# Patient Record
Sex: Female | Born: 1990 | Hispanic: Yes | Smoking: Never smoker
Health system: Southern US, Community
[De-identification: ages and names within clinical notes are randomized; demographics above are authoritative.]

## PROBLEM LIST (undated history)

## (undated) DIAGNOSIS — N390 Urinary tract infection, site not specified: Secondary | ICD-10-CM

## (undated) HISTORY — DX: Urinary tract infection, site not specified: N39.0

---

## 2011-07-29 NOTE — L&D Delivery Note (Signed)
Delivery Note At 8:33 PM a viable female was delivered via  (Presentation: ;  ).  APGAR: , ; weight .   Placenta status: , .  Cord:  with the following complications: .  Cord pH: not done  Anesthesia: Epidural  Episiotomy:  Lacerations:  Suture Repair: 2.0 vicryl Est. Blood Loss (mL):   Mom to postpartum.  Baby to nursery-stable.  Kerrilyn Azbill A 05/07/2012, 8:43 PM

## 2012-03-03 LAB — OB RESULTS CONSOLE RUBELLA ANTIBODY, IGM: Rubella: IMMUNE

## 2012-03-03 LAB — OB RESULTS CONSOLE ANTIBODY SCREEN: Antibody Screen: NEGATIVE

## 2012-05-07 ENCOUNTER — Inpatient Hospital Stay (HOSPITAL_COMMUNITY): Payer: Medicaid Other | Admitting: Anesthesiology

## 2012-05-07 ENCOUNTER — Inpatient Hospital Stay (HOSPITAL_COMMUNITY)
Admission: AD | Admit: 2012-05-07 | Discharge: 2012-05-09 | DRG: 775 | Disposition: A | Discharge status: Home / Self Care | Payer: Medicaid Other | Source: Ambulatory Visit | Attending: Obstetrics | Admitting: Obstetrics

## 2012-05-07 ENCOUNTER — Encounter (HOSPITAL_COMMUNITY): Payer: Self-pay | Admitting: *Deleted

## 2012-05-07 ENCOUNTER — Inpatient Hospital Stay (HOSPITAL_COMMUNITY)
Admission: AD | Admit: 2012-05-07 | Discharge: 2012-05-07 | Disposition: A | Discharge status: Home / Self Care | Payer: Medicaid Other | Source: Ambulatory Visit | Attending: Obstetrics | Admitting: Obstetrics

## 2012-05-07 ENCOUNTER — Encounter (HOSPITAL_COMMUNITY): Payer: Self-pay | Admitting: Anesthesiology

## 2012-05-07 DIAGNOSIS — O479 False labor, unspecified: Secondary | ICD-10-CM | POA: Insufficient documentation

## 2012-05-07 LAB — OB RESULTS CONSOLE HIV ANTIBODY (ROUTINE TESTING): HIV: NONREACTIVE

## 2012-05-07 LAB — OB RESULTS CONSOLE GBS: GBS: NEGATIVE

## 2012-05-07 LAB — CBC
HCT: 38.9 % (ref 36.0–46.0)
Hemoglobin: 12.8 g/dL (ref 12.0–15.0)
MCH: 28.9 pg (ref 26.0–34.0)
MCHC: 32.9 g/dL (ref 30.0–36.0)
MCV: 87.8 fL (ref 78.0–100.0)
Platelets: 211 K/uL (ref 150–400)
RBC: 4.43 MIL/uL (ref 3.87–5.11)
RDW: 14.2 % (ref 11.5–15.5)
WBC: 13 K/uL — ABNORMAL HIGH (ref 4.0–10.5)

## 2012-05-07 LAB — OB RESULTS CONSOLE GC/CHLAMYDIA
Chlamydia: NEGATIVE
Gonorrhea: NEGATIVE

## 2012-05-07 LAB — OB RESULTS CONSOLE ABO/RH: RH Type: POSITIVE

## 2012-05-07 LAB — OB RESULTS CONSOLE HEPATITIS B SURFACE ANTIGEN: Hepatitis B Surface Ag: NEGATIVE

## 2012-05-07 LAB — OB RESULTS CONSOLE RPR: RPR: NONREACTIVE

## 2012-05-07 MED ORDER — BENZOCAINE-MENTHOL 20-0.5 % EX AERO
1.0000 "application " | INHALATION_SPRAY | CUTANEOUS | Status: DC | PRN
  Administered 2012-05-08: 1 via TOPICAL
  Filled 2012-05-07: qty 56

## 2012-05-07 MED ORDER — LANOLIN HYDROUS EX OINT: TOPICAL_OINTMENT | CUTANEOUS | Status: DC | PRN

## 2012-05-07 MED ORDER — EPHEDRINE 5 MG/ML INJ
10.0000 mg | INTRAVENOUS | Status: DC | PRN
  Filled 2012-05-07: qty 4

## 2012-05-07 MED ORDER — EPHEDRINE 5 MG/ML INJ: 10.0000 mg | INTRAVENOUS | Status: DC | PRN

## 2012-05-07 MED ORDER — LIDOCAINE HCL (PF) 1 % IJ SOLN
INTRAMUSCULAR | Status: DC | PRN
  Administered 2012-05-07 (×4): 4 mL

## 2012-05-07 MED ORDER — TETANUS-DIPHTH-ACELL PERTUSSIS 5-2.5-18.5 LF-MCG/0.5 IM SUSP
0.5000 mL | Freq: Once | INTRAMUSCULAR | Status: AC
  Administered 2012-05-08: 0.5 mL via INTRAMUSCULAR
  Filled 2012-05-07: qty 0.5

## 2012-05-07 MED ORDER — CITRIC ACID-SODIUM CITRATE 334-500 MG/5ML PO SOLN: 30.0000 mL | ORAL | Status: DC | PRN

## 2012-05-07 MED ORDER — DIPHENHYDRAMINE HCL 25 MG PO CAPS: 25.0000 mg | ORAL_CAPSULE | Freq: Four times a day (QID) | ORAL | Status: DC | PRN

## 2012-05-07 MED ORDER — ONDANSETRON HCL 4 MG PO TABS: 4.0000 mg | ORAL_TABLET | ORAL | Status: DC | PRN

## 2012-05-07 MED ORDER — FERROUS SULFATE 325 (65 FE) MG PO TABS
325.0000 mg | ORAL_TABLET | Freq: Two times a day (BID) | ORAL | Status: DC
  Administered 2012-05-08 – 2012-05-09 (×3): 325 mg via ORAL
  Filled 2012-05-07 (×3): qty 1

## 2012-05-07 MED ORDER — FENTANYL 2.5 MCG/ML BUPIVACAINE 1/10 % EPIDURAL INFUSION (WH - ANES)
14.0000 mL/h | INTRAMUSCULAR | Status: DC
  Administered 2012-05-07 (×2): 14 mL/h via EPIDURAL
  Filled 2012-05-07: qty 125

## 2012-05-07 MED ORDER — OXYCODONE-ACETAMINOPHEN 5-325 MG PO TABS
2.0000 | ORAL_TABLET | Freq: Once | ORAL | Status: AC
  Administered 2012-05-07: 2 via ORAL
  Filled 2012-05-07: qty 2

## 2012-05-07 MED ORDER — DIBUCAINE 1 % RE OINT: 1.0000 "application " | TOPICAL_OINTMENT | RECTAL | Status: DC | PRN

## 2012-05-07 MED ORDER — SENNOSIDES-DOCUSATE SODIUM 8.6-50 MG PO TABS
2.0000 | ORAL_TABLET | Freq: Every day | ORAL | Status: DC
  Administered 2012-05-09: 2 via ORAL

## 2012-05-07 MED ORDER — SIMETHICONE 80 MG PO CHEW: 80.0000 mg | CHEWABLE_TABLET | ORAL | Status: DC | PRN

## 2012-05-07 MED ORDER — PRENATAL MULTIVITAMIN CH
1.0000 | ORAL_TABLET | Freq: Every day | ORAL | Status: DC
  Administered 2012-05-08 – 2012-05-09 (×2): 1 via ORAL
  Filled 2012-05-07 (×2): qty 1

## 2012-05-07 MED ORDER — OXYTOCIN 40 UNITS IN LACTATED RINGERS INFUSION - SIMPLE MED
62.5000 mL/h | Freq: Once | INTRAVENOUS | Status: DC
  Filled 2012-05-07: qty 1000

## 2012-05-07 MED ORDER — LACTATED RINGERS IV SOLN
500.0000 mL | Freq: Once | INTRAVENOUS | Status: AC
  Administered 2012-05-07: 1000 mL via INTRAVENOUS

## 2012-05-07 MED ORDER — PHENYLEPHRINE 40 MCG/ML (10ML) SYRINGE FOR IV PUSH (FOR BLOOD PRESSURE SUPPORT): 80.0000 ug | PREFILLED_SYRINGE | INTRAVENOUS | Status: DC | PRN

## 2012-05-07 MED ORDER — BUTORPHANOL TARTRATE 1 MG/ML IJ SOLN
1.0000 mg | INTRAMUSCULAR | Status: DC | PRN
  Administered 2012-05-07: 1 mg via INTRAVENOUS
  Filled 2012-05-07: qty 1

## 2012-05-07 MED ORDER — ACETAMINOPHEN 325 MG PO TABS: 650.0000 mg | ORAL_TABLET | ORAL | Status: DC | PRN

## 2012-05-07 MED ORDER — DIPHENHYDRAMINE HCL 50 MG/ML IJ SOLN: 12.5000 mg | INTRAMUSCULAR | Status: DC | PRN

## 2012-05-07 MED ORDER — LACTATED RINGERS IV SOLN: 500.0000 mL | INTRAVENOUS | Status: DC | PRN

## 2012-05-07 MED ORDER — LIDOCAINE HCL (PF) 1 % IJ SOLN
30.0000 mL | INTRAMUSCULAR | Status: DC | PRN
  Administered 2012-05-07: 30 mL via SUBCUTANEOUS
  Filled 2012-05-07: qty 30

## 2012-05-07 MED ORDER — OXYTOCIN BOLUS FROM INFUSION
500.0000 mL | Freq: Once | INTRAVENOUS | Status: AC
  Administered 2012-05-07: 500 mL via INTRAVENOUS
  Filled 2012-05-07: qty 500

## 2012-05-07 MED ORDER — LACTATED RINGERS IV SOLN
INTRAVENOUS | Status: DC
  Administered 2012-05-07: 15:00:00 via INTRAVENOUS

## 2012-05-07 MED ORDER — PHENYLEPHRINE 40 MCG/ML (10ML) SYRINGE FOR IV PUSH (FOR BLOOD PRESSURE SUPPORT)
80.0000 ug | PREFILLED_SYRINGE | INTRAVENOUS | Status: DC | PRN
  Filled 2012-05-07: qty 5

## 2012-05-07 MED ORDER — LIDOCAINE-EPINEPHRINE (PF) 2 %-1:200000 IJ SOLN
INTRAMUSCULAR | Status: DC | PRN
  Administered 2012-05-07: 60 mg via INTRADERMAL

## 2012-05-07 MED ORDER — ONDANSETRON HCL 4 MG/2ML IJ SOLN: 4.0000 mg | Freq: Four times a day (QID) | INTRAMUSCULAR | Status: DC | PRN

## 2012-05-07 MED ORDER — LIDOCAINE-EPINEPHRINE (PF) 2 %-1:200000 IJ SOLN: INTRAMUSCULAR | Status: DC | PRN

## 2012-05-07 MED ORDER — OXYCODONE-ACETAMINOPHEN 5-325 MG PO TABS
1.0000 | ORAL_TABLET | ORAL | Status: DC | PRN
  Filled 2012-05-07: qty 1

## 2012-05-07 MED ORDER — ZOLPIDEM TARTRATE 5 MG PO TABS: 5.0000 mg | ORAL_TABLET | Freq: Every evening | ORAL | Status: DC | PRN

## 2012-05-07 MED ORDER — IBUPROFEN 600 MG PO TABS
600.0000 mg | ORAL_TABLET | Freq: Four times a day (QID) | ORAL | Status: DC | PRN
  Administered 2012-05-07: 600 mg via ORAL
  Filled 2012-05-07 (×5): qty 1

## 2012-05-07 MED ORDER — ONDANSETRON HCL 4 MG/2ML IJ SOLN: 4.0000 mg | INTRAMUSCULAR | Status: DC | PRN

## 2012-05-07 MED ORDER — IBUPROFEN 600 MG PO TABS
600.0000 mg | ORAL_TABLET | Freq: Four times a day (QID) | ORAL | Status: DC
  Administered 2012-05-08 – 2012-05-09 (×6): 600 mg via ORAL
  Filled 2012-05-07 (×2): qty 1

## 2012-05-07 MED ORDER — WITCH HAZEL-GLYCERIN EX PADS: 1.0000 "application " | MEDICATED_PAD | CUTANEOUS | Status: DC | PRN

## 2012-05-07 MED ORDER — OXYCODONE-ACETAMINOPHEN 5-325 MG PO TABS
1.0000 | ORAL_TABLET | ORAL | Status: DC | PRN
  Administered 2012-05-08 – 2012-05-09 (×4): 1 via ORAL
  Filled 2012-05-07 (×3): qty 1

## 2012-05-07 NOTE — Anesthesia Preprocedure Evaluation (Signed)

## 2012-05-07 NOTE — MAU Note (Signed)
PT SAYS WITH INTERPRETER- ANA-   STARTING HURT BAD AT MN.      IN OFFICE - VE  1 CM ON Tuesday.   DENIES HSV AND MRSA.

## 2012-05-07 NOTE — Anesthesia Procedure Notes (Signed)
Epidural Patient location during procedure: OB Start time: 05/07/2012 4:25 PM  Staffing Performed by: anesthesiologist   Preanesthetic Checklist Completed: patient identified, site marked, surgical consent, pre-op evaluation, timeout performed, IV checked, risks and benefits discussed and monitors and equipment checked  Epidural Patient position: sitting Prep: site prepped and draped and DuraPrep Patient monitoring: continuous pulse ox and blood pressure Approach: midline Injection technique: LOR air  Needle:  Needle type: Tuohy  Needle gauge: 17 G Needle length: 9 cm and 9 Needle insertion depth: 5 cm cm Catheter type: closed end flexible Catheter size: 19 Gauge Catheter at skin depth: 10 cm Test dose: negative  Assessment Events: blood not aspirated, injection not painful, no injection resistance, negative IV test and no paresthesia  Additional Notes Discussed risk of headache, infection, bleeding, nerve injury and failed or incomplete block.  Patient voices understanding and wishes to proceed. Reason for block:procedure for pain

## 2012-05-07 NOTE — H&P (Signed)
This is Dr. Francoise Ceo dictating the history and physical on  Betty [name] she's a 21 year old prima gravida at 40 weeks and 4 days her EDC is 05/03/2012 negative GBS and she was admitted in labor cervix 5 cm 100% vertex at a 0 station she is now 8 cm 100% 0 station amniotomy was performed and the fluids clear Past medical history negative Past surgical history negative Social history noncontributory Family history negative System review negative Physical exam well-developed female in labor HEENT negative Lungs clear to P&A Heart regular rhythm no murmurs no murmurs or gallops Breasts negative Abdomen term Pelvic as described above Extremities negative

## 2012-05-07 NOTE — MAU Note (Signed)
UC's since MN. Was seen in MAU, was 3cm. UC's have continued. Bloody show.

## 2012-05-08 LAB — CBC
Hemoglobin: 11 g/dL — ABNORMAL LOW (ref 12.0–15.0)
MCH: 29.1 pg (ref 26.0–34.0)
MCHC: 32.9 g/dL (ref 30.0–36.0)

## 2012-05-08 LAB — TYPE AND SCREEN: ABO/RH(D): O POS

## 2012-05-08 MED ORDER — INFLUENZA VIRUS VACC SPLIT PF IM SUSP
0.5000 mL | INTRAMUSCULAR | Status: AC
  Administered 2012-05-08: 0.5 mL via INTRAMUSCULAR
  Filled 2012-05-08: qty 0.5

## 2012-05-08 NOTE — Progress Notes (Signed)
Patient ID: Betty [name], female   DOB: Jun 30, 1991, 21 y.o.   MRN: [id number] Postpartum day one Vital signs normal Fundus firm Lochia moderate Legs negative doing well

## 2012-05-08 NOTE — Anesthesia Postprocedure Evaluation (Signed)
Anesthesia Post Note  Patient: Betty [name]  Procedure(s) Performed: * No procedures listed *  Anesthesia type: Epidural  Patient location: Mother/Baby  Post pain: Pain level controlled  Post assessment: Post-op Vital signs reviewed  Last Vitals:  Filed Vitals:   05/08/12 0330  BP: 100/57  Pulse: 72  Temp: 36.7 C  Resp: 18    Post vital signs: Reviewed  Level of consciousness: awake  Complications: No apparent anesthesia complications

## 2012-05-09 NOTE — Discharge Summary (Signed)
Obstetric Discharge Summary Reason for Admission: onset of labor Prenatal Procedures: none Intrapartum Procedures: spontaneous vaginal delivery Postpartum Procedures: none Complications-Operative and Postpartum: none Hemoglobin  Date Value Range Status  05/08/2012 11.0* 12.0 - 15.0 g/dL Final     HCT  Date Value Range Status  05/08/2012 33.4* 36.0 - 46.0 % Final    Physical Exam:  General: alert Lochia: appropriate Uterine Fundus: firm Incision: healing well DVT Evaluation: No evidence of DVT seen on physical exam.  Discharge Diagnoses: Term Pregnancy-delivered  Discharge Information: Date: 05/09/2012 Activity: pelvic rest Diet: routine Medications: Percocet Condition: stable Instructions: refer to practice specific booklet Discharge to: home Follow-up Information    Call in 6 weeks to follow up.   Contact information:   b marshall         Newborn Data: Live born female  Birth Weight: 7 lb 5.1 oz (3320 g) APGAR: 9, 9  Home with mother.  MARSHALL,BERNARD A 05/09/2012, 6:44 AM

## 2012-05-10 NOTE — Progress Notes (Signed)
Post discharge chart review completed.  

## 2014-05-29 ENCOUNTER — Encounter (HOSPITAL_COMMUNITY): Payer: Self-pay | Admitting: *Deleted

## 2015-07-29 NOTE — L&D Delivery Note (Signed)
Delivery Note At 2:15 PM a viable and healthy female was delivered via Vaginal, Spontaneous Delivery (Presentation: OA).  APGAR: 8, 9; weight Pending.   Placenta status: Spontaneous, intact.  Cord:  with the following complications: short.  Cord pH: NA  Anesthesia: None Episiotomy: None Lacerations: None Suture Repair: None Est. Blood Loss (mL): 250  Mom to postpartum.  Baby to Couplet care / Skin to Skin. Placenta to: BS Feeding: Breast Circ: NO Contraception: Undecided  [place] 03/01/2016, 3:29 PM

## 2015-08-23 LAB — OB RESULTS CONSOLE GC/CHLAMYDIA
CHLAMYDIA, DNA PROBE: NEGATIVE
Gonorrhea: NEGATIVE

## 2015-08-23 LAB — OB RESULTS CONSOLE HIV ANTIBODY (ROUTINE TESTING): HIV: NONREACTIVE

## 2015-08-23 LAB — OB RESULTS CONSOLE ABO/RH: RH TYPE: POSITIVE

## 2015-08-23 LAB — OB RESULTS CONSOLE HGB/HCT, BLOOD
HCT: 38 %
Hemoglobin: 12.2 g/dL

## 2015-08-23 LAB — OB RESULTS CONSOLE HEPATITIS B SURFACE ANTIGEN: HEP B S AG: NEGATIVE

## 2015-08-23 LAB — OB RESULTS CONSOLE RPR: RPR: NONREACTIVE

## 2015-08-23 LAB — OB RESULTS CONSOLE ANTIBODY SCREEN: ANTIBODY SCREEN: NEGATIVE

## 2015-08-23 LAB — OB RESULTS CONSOLE RUBELLA ANTIBODY, IGM: Rubella: IMMUNE

## 2015-08-23 LAB — CULTURE, OB URINE: Urine Culture, OB: NO GROWTH

## 2015-12-17 LAB — GLUCOSE TOLERANCE, 1 HOUR: GLUCOSE 1 HOUR: 91

## 2015-12-17 LAB — OB RESULTS CONSOLE HGB/HCT, BLOOD: HEMOGLOBIN: 11 g/dL

## 2016-01-31 ENCOUNTER — Ambulatory Visit (INDEPENDENT_AMBULATORY_CARE_PROVIDER_SITE_OTHER): Payer: Self-pay | Admitting: Family

## 2016-01-31 ENCOUNTER — Encounter: Payer: Self-pay | Admitting: Family

## 2016-01-31 VITALS — BP 111/66 | HR 79 | Wt 167.9 lb

## 2016-01-31 DIAGNOSIS — Z3490 Encounter for supervision of normal pregnancy, unspecified, unspecified trimester: Secondary | ICD-10-CM | POA: Insufficient documentation

## 2016-01-31 DIAGNOSIS — O0933 Supervision of pregnancy with insufficient antenatal care, third trimester: Secondary | ICD-10-CM

## 2016-01-31 DIAGNOSIS — O0930 Supervision of pregnancy with insufficient antenatal care, unspecified trimester: Secondary | ICD-10-CM | POA: Insufficient documentation

## 2016-01-31 DIAGNOSIS — Z3483 Encounter for supervision of other normal pregnancy, third trimester: Secondary | ICD-10-CM

## 2016-01-31 DIAGNOSIS — Z3493 Encounter for supervision of normal pregnancy, unspecified, third trimester: Secondary | ICD-10-CM

## 2016-01-31 LAB — POCT URINALYSIS DIP (DEVICE)
Bilirubin Urine: NEGATIVE
GLUCOSE, UA: NEGATIVE mg/dL
Ketones, ur: NEGATIVE mg/dL
Nitrite: NEGATIVE
PROTEIN: NEGATIVE mg/dL
SPECIFIC GRAVITY, URINE: 1.02 (ref 1.005–1.030)
UROBILINOGEN UA: 0.2 mg/dL (ref 0.0–1.0)
pH: 5.5 (ref 5.0–8.0)

## 2016-01-31 NOTE — Patient Instructions (Signed)
Tercer trimestre de embarazo (Third Trimester of Pregnancy) El tercer trimestre comprende desde la semana29 hasta la semana42, es decir, desde el mes7 hasta el mes9. El tercer trimestre es un perodo en el que el feto crece rpidamente. Hacia el final del noveno mes, el feto mide alrededor de 20pulgadas (45cm) de largo y pesa entre 6 y 10 libras (2,700 y 4,500kg).  CAMBIOS EN EL ORGANISMO Su organismo atraviesa por muchos cambios durante el embarazo, y estos varan de una mujer a otra.   Seguir aumentando de peso. Es de esperar que aumente entre 25 y 35libras (11 y 16kg) hacia el final del embarazo.  Podrn aparecer las primeras estras en las caderas, el abdomen y las mamas.  Puede tener necesidad de orinar con ms frecuencia porque el feto baja hacia la pelvis y ejerce presin sobre la vejiga.  Debido al embarazo podr sentir acidez estomacal con frecuencia.  Puede estar estreida, ya que ciertas hormonas enlentecen los movimientos de los msculos que empujan los desechos a travs de los intestinos.  Pueden aparecer hemorroides o abultarse e hincharse las venas (venas varicosas).  Puede sentir dolor plvico debido al aumento de peso y a que las hormonas del embarazo relajan las articulaciones entre los huesos de la pelvis. El dolor de espalda puede ser consecuencia de la sobrecarga de los msculos que soportan la postura.  Tal vez haya cambios en el cabello que pueden incluir su engrosamiento, crecimiento rpido y cambios en la textura. Adems, a algunas mujeres se les cae el cabello durante o despus del embarazo, o tienen el cabello seco o fino. Lo ms probable es que el cabello se le normalice despus del nacimiento del beb.  Las mamas seguirn creciendo y le dolern. A veces, puede haber una secrecin amarilla de las mamas llamada calostro.  El ombligo puede salir hacia afuera.  Puede sentir que le falta el aire debido a que se expande el tero.  Puede notar que el feto  "baja" o lo siente ms bajo, en el abdomen.  Puede tener una prdida de secrecin mucosa con sangre. Esto suele ocurrir en el trmino de unos pocos das a una semana antes de que comience el trabajo de parto.  El cuello del tero se vuelve delgado y blando (se borra) cerca de la fecha de parto. QU DEBE ESPERAR EN LOS EXMENES PRENATALES  Le harn exmenes prenatales cada 2semanas hasta la semana36. A partir de ese momento le harn exmenes semanales. Durante una visita prenatal de rutina:  La pesarn para asegurarse de que usted y el feto estn creciendo normalmente.  Le tomarn la presin arterial.  Le medirn el abdomen para controlar el desarrollo del beb.  Se escucharn los latidos cardacos fetales.  Se evaluarn los resultados de los estudios solicitados en visitas anteriores.  Le revisarn el cuello del tero cuando est prxima la fecha de parto para controlar si este se ha borrado. Alrededor de la semana36, el mdico le revisar el cuello del tero. Al mismo tiempo, realizar un anlisis de las secreciones del tejido vaginal. Este examen es para determinar si hay un tipo de bacteria, estreptococo Grupo B. El mdico le explicar esto con ms detalle. El mdico puede preguntarle lo siguiente:  Cmo le gustara que fuera el parto.  Cmo se siente.  Si siente los movimientos del beb.  Si ha tenido sntomas anormales, como prdida de lquido, sangrado, dolores de cabeza intensos o clicos abdominales.  Si est consumiendo algn producto que contenga tabaco, como cigarrillos, tabaco   de mascar y cigarrillos electrnicos.  Si tiene alguna pregunta. Otros exmenes o estudios de deteccin que pueden realizarse durante el tercer trimestre incluyen lo siguiente:  Anlisis de sangre para controlar los niveles de hierro (anemia).  Controles fetales para determinar su salud, nivel de actividad y crecimiento. Si tiene alguna enfermedad o hay problemas durante el embarazo, le harn  estudios.  Prueba del VIH (virus de inmunodeficiencia humana). Si corre un riesgo alto, pueden realizarle una prueba de deteccin del VIH durante el tercer trimestre del embarazo. FALSO TRABAJO DE PARTO Es posible que sienta contracciones leves e irregulares que finalmente desaparecen. Se llaman contracciones de Braxton Hicks o falso trabajo de parto. Las contracciones pueden durar horas, das o incluso semanas, antes de que el verdadero trabajo de parto se inicie. Si las contracciones ocurren a intervalos regulares, se intensifican o se hacen dolorosas, lo mejor es que la revise el mdico.  SIGNOS DE TRABAJO DE PARTO   Clicos de tipo menstrual.  Contracciones cada 5minutos o menos.  Contracciones que comienzan en la parte superior del tero y se extienden hacia abajo, a la zona inferior del abdomen y la espalda.  Sensacin de mayor presin en la pelvis o dolor de espalda.  Una secrecin de mucosidad acuosa o con sangre que sale de la vagina. Si tiene alguno de estos signos antes de la semana37 del embarazo, llame a su mdico de inmediato. Debe concurrir al hospital para que la controlen inmediatamente. INSTRUCCIONES PARA EL CUIDADO EN EL HOGAR   Evite fumar, consumir hierbas, beber alcohol y tomar frmacos que no le hayan recetado. Estas sustancias qumicas afectan la formacin y el desarrollo del beb.  No consuma ningn producto que contenga tabaco, lo que incluye cigarrillos, tabaco de mascar y cigarrillos electrnicos. Si necesita ayuda para dejar de fumar, consulte al mdico. Puede recibir asesoramiento y otro tipo de recursos para dejar de fumar.  Siga las indicaciones del mdico en relacin con el uso de medicamentos. Durante el embarazo, hay medicamentos que son seguros de tomar y otros que no.  Haga ejercicio solamente como se lo haya indicado el mdico. Sentir clicos uterinos es un buen signo para detener la actividad fsica.  Contine comiendo alimentos sanos con  regularidad.  Use un sostn que le brinde buen soporte si le duelen las mamas.  No se d baos de inmersin en agua caliente, baos turcos ni saunas.  Use el cinturn de seguridad en todo momento mientras conduce.  No coma carne cruda ni queso sin cocinar; evite el contacto con las bandejas sanitarias de los gatos y la tierra que estos animales usan. Estos elementos contienen grmenes que pueden causar defectos congnitos en el beb.  Tome las vitaminas prenatales.  Tome entre 1500 y 2000mg de calcio diariamente comenzando en la semana20 del embarazo hasta el parto.  Si est estreida, pruebe un laxante suave (si el mdico lo autoriza). Consuma ms alimentos ricos en fibra, como vegetales y frutas frescos y cereales integrales. Beba gran cantidad de lquido para mantener la orina de tono claro o color amarillo plido.  Dese baos de asiento con agua tibia para aliviar el dolor o las molestias causadas por las hemorroides. Use una crema para las hemorroides si el mdico la autoriza.  Si tiene venas varicosas, use medias de descanso. Eleve los pies durante 15minutos, 3 o 4veces por da. Limite el consumo de sal en su dieta.  Evite levantar objetos pesados, use zapatos de tacones bajos y mantenga una buena postura.  Descanse   con las piernas elevadas si tiene calambres o dolor de cintura.  Visite a su dentista si no lo ha hecho durante el embarazo. Use un cepillo de dientes blando para higienizarse los dientes y psese el hilo dental con suavidad.  Puede seguir manteniendo relaciones sexuales, a menos que el mdico le indique lo contrario.  No haga viajes largos excepto que sea absolutamente necesario y solo con la autorizacin del mdico.  Tome clases prenatales para entender, practicar y hacer preguntas sobre el trabajo de parto y el parto.  Haga un ensayo de la partida al hospital.  Prepare el bolso que llevar al hospital.  Prepare la habitacin del beb.  Concurra a todas  las visitas prenatales segn las indicaciones de su mdico. SOLICITE ATENCIN MDICA SI:  No est segura de que est en trabajo de parto o de que ha roto la bolsa de las aguas.  Tiene mareos.  Siente clicos leves, presin en la pelvis o dolor persistente en el abdomen.  Tiene nuseas, vmitos o diarrea persistentes.  Observa una secrecin vaginal con mal olor.  Siente dolor al orinar. SOLICITE ATENCIN MDICA DE INMEDIATO SI:   Tiene fiebre.  Tiene una prdida de lquido por la vagina.  Tiene sangrado o pequeas prdidas vaginales.  Siente dolor intenso o clicos en el abdomen.  Sube o baja de peso rpidamente.  Tiene dificultad para respirar y siente dolor de pecho.  Sbitamente se le hinchan mucho el rostro, las manos, los tobillos, los pies o las piernas.  No ha sentido los movimientos del beb durante una hora.  Siente un dolor de cabeza intenso que no se alivia con medicamentos.  Su visin se modifica.   Esta informacin no tiene como fin reemplazar el consejo del mdico. Asegrese de hacerle al mdico cualquier pregunta que tenga.   Document Released: 04/23/2005 Document Revised: 08/04/2014 Elsevier Interactive Patient Education 2016 [employer]  

## 2016-01-31 NOTE — Progress Notes (Signed)
New ob/28 wk packet given  Video Interpreter [phone]

## 2016-01-31 NOTE — Progress Notes (Signed)
   Subjective:    Betty [name] is a G3P1001 4451w5d being seen today for her first obstetrical visit.  Her obstetrical history is significant for term NSVD in 2013.  Transfer from [place] at 34 weeks. Patient does intend to breast feed. Pregnancy history fully reviewed.  Patient reports no complaints.  Filed Vitals:   01/31/16 0809  BP: 111/66  Pulse: 79  Weight: 167 lb 14.4 oz (76.159 kg)    HISTORY: OB History  Gravida Para Term Preterm AB SAB TAB Ectopic Multiple Living  3 1 1       1     # Outcome Date GA Lbr Len/2nd Weight Sex Delivery Anes PTL Lv  3 Current           2 Term 05/07/12 8333w4d 05:51 / 00:42 7 lb 5.1 oz (3.32 kg) F Vag-Spont EPI  Y  1 Gravida              History reviewed. No pertinent past medical history. History reviewed. No pertinent past surgical history. History reviewed. No pertinent family history.   Exam    Uterus:  Fundal Height: 35 cm   Filed Vitals:   01/31/16 0809  BP: 111/66  Pulse: 79  Weight: 167 lb 14.4 oz (76.159 kg)    Fetal Status: Fetal Heart Rate (bpm): 161 Fundal Height: 35 cm Movement: Present     General:  Alert, oriented and cooperative. Patient is in no acute distress.  Skin: Skin is warm and dry. No rash noted.   Cardiovascular: Normal heart rate noted  Respiratory: Normal respiratory effort, no problems with respiration noted  Abdomen: Soft, gravid, appropriate for gestational age. Pain/Pressure: Absent     Pelvic: Vag. Bleeding: None     Cervical exam deferred        Extremities: Normal range of motion.  Edema: Trace  Mental Status: Normal mood and affect. Normal behavior. Normal judgment and thought content.   Urinalysis: Urine Protein: Negative Urine Glucose: Negative    Assessment:    Pregnancy: G3P1001 Patient Active Problem List   Diagnosis Date Noted  . Supervision of normal pregnancy, antepartum 01/31/2016        Plan:     Prenatal records and labs reviewed.   Continue with prenatal  vitamins. Problem list reviewed and updated. Genetic Screening:  Negative quad screen  Ultrasound discussed; fetal survey: results reviewed.  Follow up in 2 weeks.  Marlis EdelsonKARIM, Antonique Langford N 01/31/2016

## 2016-02-15 ENCOUNTER — Ambulatory Visit (INDEPENDENT_AMBULATORY_CARE_PROVIDER_SITE_OTHER): Payer: Self-pay | Admitting: Obstetrics & Gynecology

## 2016-02-15 VITALS — BP 108/68 | HR 77 | Wt 170.0 lb

## 2016-02-15 DIAGNOSIS — Z113 Encounter for screening for infections with a predominantly sexual mode of transmission: Secondary | ICD-10-CM

## 2016-02-15 DIAGNOSIS — O0933 Supervision of pregnancy with insufficient antenatal care, third trimester: Secondary | ICD-10-CM

## 2016-02-15 DIAGNOSIS — Z3483 Encounter for supervision of other normal pregnancy, third trimester: Secondary | ICD-10-CM

## 2016-02-15 DIAGNOSIS — Z3493 Encounter for supervision of normal pregnancy, unspecified, third trimester: Secondary | ICD-10-CM

## 2016-02-15 LAB — OB RESULTS CONSOLE GC/CHLAMYDIA
Chlamydia: NEGATIVE
Gonorrhea: NEGATIVE

## 2016-02-15 LAB — OB RESULTS CONSOLE GBS: GBS: NEGATIVE

## 2016-02-15 NOTE — Progress Notes (Signed)
Subjective:  Betty [name] is a 25 y.o. H G3P1001 at 1671w6d being seen today for ongoing prenatal care.  She is currently monitored for the following issues for this low-risk pregnancy and has Supervision of normal pregnancy, antepartum on her problem list.  Patient reports no complaints.  Contractions: Irritability. Vag. Bleeding: None.  Movement: Present. Denies leaking of fluid.   The following portions of the patient's history were reviewed and updated as appropriate: allergies, current medications, past family history, past medical history, past social history, past surgical history and problem list. Problem list updated.  Objective:   Filed Vitals:   02/15/16 1055  BP: 108/68  Pulse: 77  Weight: 170 lb (77.111 kg)    Fetal Status: Fetal Heart Rate (bpm): 145   Movement: Present     General:  Alert, oriented and cooperative. Patient is in no acute distress.  Skin: Skin is warm and dry. No rash noted.   Cardiovascular: Normal heart rate noted  Respiratory: Normal respiratory effort, no problems with respiration noted  Abdomen: Soft, gravid, appropriate for gestational age. Pain/Pressure: Present     Pelvic:  Cervical exam deferred        Extremities: Normal range of motion.  Edema: Trace  Mental Status: Normal mood and affect. Normal behavior. Normal judgment and thought content.   Urinalysis:      Assessment and Plan:  Pregnancy: G3P1001 at 4371w6d  1. Supervision of normal pregnancy in third trimester  - GC/Chlamydia probe amp ([place])not at [facility] - Culture, beta strep (group b only)  2. Supervision of normal pregnancy, antepartum, third trimester   Term labor symptoms and general obstetric precautions including but not limited to vaginal bleeding, contractions, leaking of fluid and fetal movement were reviewed in detail with the patient. Please refer to After Visit Summary for other counseling recommendations.  Return in about 1 week (around 02/22/2016).   Allie BossierMyra C  Lundon Verdejo, MD

## 2016-02-15 NOTE — Progress Notes (Signed)
Video interpreter [phone]

## 2016-02-17 LAB — CULTURE, BETA STREP (GROUP B ONLY)

## 2016-02-19 LAB — GC/CHLAMYDIA PROBE AMP (~~LOC~~) NOT AT ARMC
Chlamydia: NEGATIVE
Neisseria Gonorrhea: NEGATIVE

## 2016-02-25 ENCOUNTER — Ambulatory Visit (INDEPENDENT_AMBULATORY_CARE_PROVIDER_SITE_OTHER): Payer: Self-pay | Admitting: Obstetrics and Gynecology

## 2016-02-25 VITALS — BP 108/58 | HR 74 | Wt 174.1 lb

## 2016-02-25 DIAGNOSIS — O0933 Supervision of pregnancy with insufficient antenatal care, third trimester: Secondary | ICD-10-CM

## 2016-02-25 DIAGNOSIS — Z3483 Encounter for supervision of other normal pregnancy, third trimester: Secondary | ICD-10-CM

## 2016-02-25 LAB — POCT URINALYSIS DIP (DEVICE)
Bilirubin Urine: NEGATIVE
GLUCOSE, UA: NEGATIVE mg/dL
HGB URINE DIPSTICK: NEGATIVE
KETONES UR: NEGATIVE mg/dL
Nitrite: NEGATIVE
PH: 6.5 (ref 5.0–8.0)
PROTEIN: NEGATIVE mg/dL
SPECIFIC GRAVITY, URINE: 1.015 (ref 1.005–1.030)
Urobilinogen, UA: 0.2 mg/dL (ref 0.0–1.0)

## 2016-02-25 NOTE — Progress Notes (Signed)
Prenatal Visit Note Date: 02/25/2016 Clinic: [facility], low risk  Subjective:  Betty [name] is a 25 y.o. G3P1001 at [redacted]w[redacted]d being seen today for ongoing prenatal care.  She is currently monitored for the following issues for this low-risk pregnancy and has Supervision of normal pregnancy, antepartum on her problem list.  Patient reports no complaints.   Contractions: Irritability. Vag. Bleeding: None.  Movement: Present. Denies leaking of fluid.   The following portions of the patient's history were reviewed and updated as appropriate: allergies, current medications, past family history, past medical history, past social history, past surgical history and problem list. Problem list updated.  Objective:   Vitals:   02/25/16 1616  BP: (!) 108/58  Pulse: 74  Weight: 174 lb 1.6 oz (79 kg)    Fetal Status: Fetal Heart Rate (bpm): 154   Movement: Present     General:  Alert, oriented and cooperative. Patient is in no acute distress.  Skin: Skin is warm and dry. No rash noted.   Cardiovascular: Normal heart rate noted  Respiratory: Normal respiratory effort, no problems with respiration noted  Abdomen: Soft, gravid, appropriate for gestational age. Pain/Pressure: Present     Pelvic:  Cervical exam deferred        Extremities: Normal range of motion.     Mental Status: Normal mood and affect. Normal behavior. Normal judgment and thought content.   Urinalysis:      Assessment and Plan:  Pregnancy: G3P1001 at [redacted]w[redacted]d Routine PNC. GBS neg. Pt to consider BC options  Term labor symptoms and general obstetric precautions including but not limited to vaginal bleeding, contractions, leaking of fluid and fetal movement were reviewed in detail with the patient. Please refer to After Visit Summary for other counseling recommendations.  Return in about 1 week (around 03/03/2016).   Gunnison Bing, MD

## 2016-03-01 ENCOUNTER — Inpatient Hospital Stay (HOSPITAL_COMMUNITY)
Admission: AD | Admit: 2016-03-01 | Discharge: 2016-03-02 | DRG: 775 | Disposition: A | Discharge status: Home / Self Care | Payer: Medicaid Other | Source: Ambulatory Visit | Attending: Family Medicine | Admitting: Family Medicine

## 2016-03-01 ENCOUNTER — Encounter (HOSPITAL_COMMUNITY): Payer: Self-pay | Admitting: *Deleted

## 2016-03-01 DIAGNOSIS — Z3A39 39 weeks gestation of pregnancy: Secondary | ICD-10-CM

## 2016-03-01 DIAGNOSIS — Z3483 Encounter for supervision of other normal pregnancy, third trimester: Secondary | ICD-10-CM | POA: Diagnosis present

## 2016-03-01 DIAGNOSIS — IMO0001 Reserved for inherently not codable concepts without codable children: Secondary | ICD-10-CM

## 2016-03-01 LAB — CBC
HCT: 35.5 % — ABNORMAL LOW (ref 36.0–46.0)
Hemoglobin: 11.5 g/dL — ABNORMAL LOW (ref 12.0–15.0)
MCH: 26.6 pg (ref 26.0–34.0)
MCHC: 32.4 g/dL (ref 30.0–36.0)
MCV: 82 fL (ref 78.0–100.0)
PLATELETS: 220 10*3/uL (ref 150–400)
RBC: 4.33 MIL/uL (ref 3.87–5.11)
RDW: 14.9 % (ref 11.5–15.5)
WBC: 15.4 10*3/uL — AB (ref 4.0–10.5)

## 2016-03-01 LAB — TYPE AND SCREEN
ABO/RH(D): O POS
Antibody Screen: NEGATIVE

## 2016-03-01 LAB — ABO/RH: ABO/RH(D): O POS

## 2016-03-01 MED ORDER — OXYCODONE-ACETAMINOPHEN 5-325 MG PO TABS: 2.0000 | ORAL_TABLET | ORAL | Status: DC | PRN

## 2016-03-01 MED ORDER — OXYCODONE-ACETAMINOPHEN 5-325 MG PO TABS: 1.0000 | ORAL_TABLET | ORAL | Status: DC | PRN

## 2016-03-01 MED ORDER — FERROUS SULFATE 325 (65 FE) MG PO TABS
325.0000 mg | ORAL_TABLET | Freq: Two times a day (BID) | ORAL | Status: DC
  Administered 2016-03-02: 325 mg via ORAL
  Filled 2016-03-01: qty 1

## 2016-03-01 MED ORDER — LACTATED RINGERS IV SOLN
INTRAVENOUS | Status: DC
  Administered 2016-03-01: 13:00:00 via INTRAVENOUS

## 2016-03-01 MED ORDER — ONDANSETRON HCL 4 MG PO TABS: 4.0000 mg | ORAL_TABLET | ORAL | Status: DC | PRN

## 2016-03-01 MED ORDER — FENTANYL CITRATE (PF) 100 MCG/2ML IJ SOLN
100.0000 ug | INTRAMUSCULAR | Status: DC | PRN
  Administered 2016-03-01: 100 ug via INTRAVENOUS

## 2016-03-01 MED ORDER — OXYTOCIN BOLUS FROM INFUSION
500.0000 mL | Freq: Once | INTRAVENOUS | Status: AC
  Administered 2016-03-01: 500 mL via INTRAVENOUS

## 2016-03-01 MED ORDER — LACTATED RINGERS IV SOLN: 500.0000 mL | INTRAVENOUS | Status: DC | PRN

## 2016-03-01 MED ORDER — OXYTOCIN 40 UNITS IN LACTATED RINGERS INFUSION - SIMPLE MED
2.5000 [IU]/h | INTRAVENOUS | Status: DC
  Administered 2016-03-01: 2.5 [IU]/h via INTRAVENOUS
  Filled 2016-03-01: qty 1000

## 2016-03-01 MED ORDER — PRENATAL MULTIVITAMIN CH
1.0000 | ORAL_TABLET | Freq: Every day | ORAL | Status: DC
  Administered 2016-03-02: 1 via ORAL
  Filled 2016-03-01: qty 1

## 2016-03-01 MED ORDER — SOD CITRATE-CITRIC ACID 500-334 MG/5ML PO SOLN: 30.0000 mL | ORAL | Status: DC | PRN

## 2016-03-01 MED ORDER — DIBUCAINE 1 % RE OINT: 1.0000 "application " | TOPICAL_OINTMENT | RECTAL | Status: DC | PRN

## 2016-03-01 MED ORDER — ONDANSETRON HCL 4 MG/2ML IJ SOLN: 4.0000 mg | Freq: Four times a day (QID) | INTRAMUSCULAR | Status: DC | PRN

## 2016-03-01 MED ORDER — TETANUS-DIPHTH-ACELL PERTUSSIS 5-2.5-18.5 LF-MCG/0.5 IM SUSP: 0.5000 mL | Freq: Once | INTRAMUSCULAR | Status: DC

## 2016-03-01 MED ORDER — ONDANSETRON HCL 4 MG/2ML IJ SOLN
4.0000 mg | INTRAMUSCULAR | Status: DC | PRN
Start: 2016-03-01 — End: 2016-03-02

## 2016-03-01 MED ORDER — COCONUT OIL OIL: 1.0000 "application " | TOPICAL_OIL | Status: DC | PRN

## 2016-03-01 MED ORDER — FLEET ENEMA 7-19 GM/118ML RE ENEM: 1.0000 | ENEMA | RECTAL | Status: DC | PRN

## 2016-03-01 MED ORDER — SENNOSIDES-DOCUSATE SODIUM 8.6-50 MG PO TABS
2.0000 | ORAL_TABLET | ORAL | Status: DC
  Administered 2016-03-01: 2 via ORAL
  Filled 2016-03-01: qty 2

## 2016-03-01 MED ORDER — FENTANYL CITRATE (PF) 100 MCG/2ML IJ SOLN
INTRAMUSCULAR | Status: AC
  Filled 2016-03-01: qty 2

## 2016-03-01 MED ORDER — MEASLES, MUMPS & RUBELLA VAC ~~LOC~~ INJ
0.5000 mL | INJECTION | Freq: Once | SUBCUTANEOUS | Status: DC
  Filled 2016-03-01: qty 0.5

## 2016-03-01 MED ORDER — SIMETHICONE 80 MG PO CHEW: 80.0000 mg | CHEWABLE_TABLET | ORAL | Status: DC | PRN

## 2016-03-01 MED ORDER — BENZOCAINE-MENTHOL 20-0.5 % EX AERO: 1.0000 "application " | INHALATION_SPRAY | CUTANEOUS | Status: DC | PRN

## 2016-03-01 MED ORDER — IBUPROFEN 600 MG PO TABS
600.0000 mg | ORAL_TABLET | Freq: Four times a day (QID) | ORAL | Status: DC
  Administered 2016-03-01 – 2016-03-02 (×4): 600 mg via ORAL
  Filled 2016-03-01 (×4): qty 1

## 2016-03-01 MED ORDER — ZOLPIDEM TARTRATE 5 MG PO TABS: 5.0000 mg | ORAL_TABLET | Freq: Every evening | ORAL | Status: DC | PRN

## 2016-03-01 MED ORDER — MAGNESIUM HYDROXIDE 400 MG/5ML PO SUSP: 30.0000 mL | ORAL | Status: DC | PRN

## 2016-03-01 MED ORDER — DIPHENHYDRAMINE HCL 25 MG PO CAPS: 25.0000 mg | ORAL_CAPSULE | Freq: Four times a day (QID) | ORAL | Status: DC | PRN

## 2016-03-01 MED ORDER — WITCH HAZEL-GLYCERIN EX PADS: 1.0000 "application " | MEDICATED_PAD | CUTANEOUS | Status: DC | PRN

## 2016-03-01 MED ORDER — LIDOCAINE HCL (PF) 1 % IJ SOLN
30.0000 mL | INTRAMUSCULAR | Status: DC | PRN
  Filled 2016-03-01: qty 30

## 2016-03-01 MED ORDER — ACETAMINOPHEN 325 MG PO TABS
650.0000 mg | ORAL_TABLET | ORAL | Status: DC | PRN
  Administered 2016-03-01 – 2016-03-02 (×2): 650 mg via ORAL
  Filled 2016-03-01 (×2): qty 2

## 2016-03-01 MED ORDER — ACETAMINOPHEN 325 MG PO TABS: 650.0000 mg | ORAL_TABLET | ORAL | Status: DC | PRN

## 2016-03-01 NOTE — Lactation Note (Signed)
This note was copied from a baby's chart. Lactation Consultation Note Initial visit at 3 hours of age with Spanish interpreter, Nettie Elm.  Mom reports baby was just fed and still hungry.  Mom reports longs feedings of 45-55 minutes, she denies pain with latch and reports hearing swallows.  Lc encouraged mom to keep baby active during feedings.  Mom is able to hand express colostrum easily.  LC assisted with "laid back" position. Mom was attempting cradle with babys body turned away from breast.  Mom need support with positioning baby appropriately.  Baby latched well with wide gape and strong sucking.  Stimulation needed to maintain feeding and few swallows audible. [facility] LC resources given and discussed.  Encouraged to feed with early cues on demand.  Early newborn behavior discussed. Mom to call for assist as needed.   LC discussed delay of bottle/formula feeding at length and mom reports she is informed and plans to use formula/bottles here.    Patient Name: Betty [name] HUTML'Y Date: 03/01/2016 Reason for consult: Initial assessment   Maternal Data Has patient been taught Hand Expression?: Yes Does the patient have breastfeeding experience prior to this delivery?: Yes  Feeding Feeding Type: Breast Fed Length of feed:  (several minutes observed)  LATCH Score/Interventions Latch: Grasps breast easily, tongue down, lips flanged, rhythmical sucking.  Audible Swallowing: A few with stimulation Intervention(s): Skin to skin;Hand expression;Alternate breast massage  Type of Nipple: Everted at rest and after stimulation  Comfort (Breast/Nipple): Soft / non-tender     Hold (Positioning): Assistance needed to correctly position infant at breast and maintain latch. Intervention(s): Breastfeeding basics reviewed;Support Pillows;Position options;Skin to skin  LATCH Score: 8  Lactation Tools Discussed/Used WIC Program: Yes   Consult Status Consult Status: Follow-up Date:  03/02/16 Follow-up type: In-patient    Zahi Plaskett, Arvella Merles 03/01/2016, 5:55 PM

## 2016-03-01 NOTE — H&P (Signed)
HPI: Betty [name] is a 25 y.o. year old G17P1001 female at [redacted]w[redacted]d weeks gestation who presents to MAU reporting contractions, No evidence of rupture of membranes. 5 cm in MAU. Contracting Q2-3 minutes, extremely uncomfortable.   Transfer from [place] @ 34 wks [facility] Prenatal Labs  Dating  LMP, consistent with 12 wk ultrasound Blood type:   O+  Genetic Screen   Quad: neg      Antibody: Neg  Anatomic [place] 20 wks, nml ([place]) Rubella:  Immune  GTT Early:               Third trimester: 91 RPR:   NR  Flu vaccine  Did not obtain HBsAg:   Negative  TDaP vaccine   12/17/15                                            HIV:   Negative  Baby Food  Breast                                             GBS:  Negative  Contraception  Pap:  Circumcision  Declines   Pediatrician  [facility]   Support Person  Nahun Ucles (FOB)      OB History    Gravida Para Term Preterm AB Living   3 1 1     1    SAB TAB Ectopic Multiple Live Births           1     History reviewed. No pertinent past medical history. History reviewed. No pertinent surgical history. Family History: family history is not on file. Social History:  reports that she has never smoked. She has never used smokeless tobacco. She reports that she does not drink alcohol or use drugs.     Maternal Diabetes: No Genetic Screening: Normal Maternal Ultrasounds/Referrals: Normal Fetal Ultrasounds or other Referrals:  None Maternal Substance Abuse:  No Significant Maternal Medications:  Meds include: Other: None Significant Maternal Lab Results:  Lab values include: Group B Strep negative Other Comments:  None  Review of Systems  Constitutional: Negative for chills and fever.  Gastrointestinal: Positive for abdominal pain.  Genitourinary:       Neg for VB and LOF   History   Blood pressure 111/59, pulse 78, temperature 98.5 F (36.9 C), temperature source Oral, resp. rate 20, last menstrual period 06/02/2015, unknown  if currently breastfeeding. Maternal Exam:  Uterine Assessment: Contraction strength is firm.  Contraction frequency is regular.   Abdomen: Patient reports no abdominal tenderness. Estimated fetal weight is 7 lb.   Fetal presentation: vertex  Introitus: Normal vulva. Normal vagina.  Pelvis: adequate for delivery.   Cervix: Cervix evaluated by digital exam.     Fetal Exam Fetal Monitor Review: Mode: ultrasound.   Baseline rate: 140.  Variability: moderate (6-25 bpm).   Pattern: accelerations present and no decelerations.    Fetal State Assessment: Category I - tracings are normal.     Physical Exam  Nursing note and vitals reviewed. Constitutional: She is oriented to person, place, and time. She appears well-developed and well-nourished. She appears distressed.  HENT:  Head: Normocephalic.  Eyes: Conjunctivae are normal.  Cardiovascular: Normal rate, regular rhythm and normal heart sounds.  Respiratory: Effort normal and breath sounds normal.  GI: Soft. There is no tenderness.  Genitourinary: Vagina normal.  Musculoskeletal: She exhibits no edema.  Neurological: She is alert and oriented to person, place, and time. She has normal reflexes.  Skin: Skin is warm and dry.  Psychiatric: She has a normal mood and affect.    Prenatal labs: ABO, Rh:  O pos Antibody:  Neg Rubella: Immune (01/26 0000) RPR: Nonreactive (01/26 0000)  HBsAg: Negative (01/26 0000)  HIV: Non-reactive (01/26 0000)  GBS:   Neg  Assessment: 1. Labor: Early->Active 2. Fetal Wellbeing: Category I  3. Pain Control: Considering epidural 4. GBS: Neg 5. 39.0 week IUP  Plan:  1. Admit to BS per consult with MD 2. Routine L&D orders 3. Analgesia/anesthesia PRN   Dorathy Kinsman 03/01/2016, 12:05 PM

## 2016-03-01 NOTE — MAU Note (Signed)
Contractions started at 0550

## 2016-03-02 LAB — RPR: RPR Ser Ql: NONREACTIVE

## 2016-03-02 NOTE — Lactation Note (Signed)
This note was copied from a baby's chart. Lactation Consultation Note brief follow up consult with this mom and baby. Mom reports breast feeding going well, but has supplemented with formula twice due to long feeds. I gave mom a hand pump, and advised her to supplemetntwith EBm. Mom knows to call for questions/concerns.   Patient Name: Boy Fernande BoydenWendy Diaz-Soto ZOXWR'UToday's Date: 03/02/2016 Reason for consult: Follow-up assessment   Maternal Data    Feeding    LATCH Score/Interventions                      Lactation Tools Discussed/Used     Consult Status Consult Status: Complete Follow-up type: Call as needed    Alfred LevinsLee, Aurilla Coulibaly Anne 03/02/2016, 11:55 AM

## 2016-03-02 NOTE — Discharge Instructions (Signed)
Parto vaginal, Cuidados posteriores  °(Vaginal Delivery, Care After) °Siga estas instrucciones durante las próximas semanas. Estas indicaciones para el alta le proporcionan información general acerca de cómo deberá cuidarse después del parto. El médico también podrá darle instrucciones específicas. El tratamiento ha sido planificado según las prácticas médicas actuales, pero en algunos casos pueden ocurrir problemas. Comuníquese con el médico si tiene algún problema o tiene preguntas al volver a su casa.  °INSTRUCCIONES PARA EL CUIDADO EN EL HOGAR  °· Tome sólo medicamentos de venta libre o recetados, según las indicaciones del médico o del farmacéutico. °· No beba alcohol, especialmente si está amamantando o toma analgésicos. °· No mastique tabaco ni fume. °· No consuma drogas. °· Continúe con un adecuado cuidado perineal. El buen cuidado perineal incluye: °¨ Higienizarse de adelante hacia atrás. °¨ Mantener la zona perineal limpia. °· No use tampones ni duchas vaginales hasta que su médico la autorice. °· Dúchese, lávese el cabello y tome baños de inmersión según las indicaciones de su médico. °· Utilice un sostén que le ajuste bien y que brinde buen soporte a sus mamas. °· Consuma alimentos saludables. °· Beba suficiente líquido para mantener la orina clara o de color amarillo pálido. °· Consuma alimentos ricos en fibra como cereales y panes integrales, arroz, frijoles y frutas y verduras frescas todos los días. Estos alimentos pueden ayudarla a prevenir o aliviar el estreñimiento. °· Siga las recomendaciones de su médico relacionadas con la reanudación de actividades como subir escaleras, conducir automóviles, levantar objetos, hacer ejercicios o viajar. °· Hable con su médico acerca de reanudar la actividad sexual. Volver a la actividad sexual depende del riesgo de infección, la velocidad de la curación y la comodidad y su deseo de reanudarla. °· Trate de que alguien la ayude con las actividades del hogar y con  el recién nacido al menos durante un par de días después de salir del hospital. °· Descanse todo lo que pueda. Trate de descansar o tomar una siesta mientras el bebé está durmiendo. °· Aumente sus actividades gradualmente. °· Cumpla con todas las visitas de control programadas para después del parto. Es muy importante asistir a todas las citas programadas de seguimiento. En estas citas, su médico va a controlarla para asegurarse de que esté sanando física y emocionalmente. °SOLICITE ATENCIÓN MÉDICA SI:  °· Elimina coágulos grandes por la vagina. Guarde algunos coágulos para mostrarle al médico. °· Tiene una secreción con feo olor que proviene de la vagina. °· Tiene dificultad para orinar. °· Orina con frecuencia. °· Siente dolor al orinar. °· Nota un cambio en sus movimientos intestinales. °· Aumenta el enrojecimiento, el dolor o la hinchazón en la zona de la incisión vaginal (episiotomía) o el desgarro vaginal. °· Tiene pus que drena por la episiotomía o el desgarro vaginal. °· La episiotomía o el desgarro vaginal se abren. °· Sus mamas le duelen, están duras o enrojecidas. °· Sufre un dolor intenso de cabeza. °· Tiene visión borrosa o ve manchas. °· Se siente triste o deprimida. °· Tiene pensamientos acerca de lastimarse o dañar al recién nacido. °· Tiene preguntas acerca de su cuidado personal, el cuidado del recién nacido o acerca de los medicamentos. °· Se siente mareada o sufre un desmayo. °· Tiene una erupción. °· Tiene náuseas o vómitos. °· Usted amamantó al bebé y no ha tenido su período menstrual dentro de las 12 semanas después de dejar de amamantar. °· No amamanta al bebé y no tuvo su período menstrual en las últimas 12° semanas después del   parto. °· Tiene fiebre. °SOLICITE ATENCIÓN MÉDICA DE INMEDIATO SI:  °· Siente dolor persistente. °· Siente dolor en el pecho. °· Le falta el aire. °· Se desmaya. °· Siente dolor en la pierna. °· Siente dolor en el estómago. °· El sangrado vaginal satura dos o más  apósitos en 1 hora. °  °Esta información no tiene como fin reemplazar el consejo del médico. Asegúrese de hacerle al médico cualquier pregunta que tenga. °  °Document Released: 07/14/2005 Document Revised: 04/04/2015 °Elsevier Interactive Patient Education ©2016 Elsevier Inc. ° °

## 2016-03-02 NOTE — Discharge Summary (Signed)
    OB Discharge Summary     Patient Name: Betty BoydenWendy Diaz-Soto DOB: 03/10/1991 MRN: [id number]  Date of admission: 03/01/2016 Delivering MD: Dorathy KinsmanSMITH, [place]   Date of discharge: 03/02/2016  Admitting diagnosis: 39 WKS, CTXS Intrauterine pregnancy: 6770w0d     Secondary diagnosis:  Active Problems:   Normal labor   Vaginal delivery  Additional problems: none     Discharge diagnosis: Term Pregnancy Delivered                                                                                                Post partum procedures:none  Augmentation: none  Complications: None  Hospital course:  Onset of Labor With Vaginal Delivery     25 y.o. yo G3P2002 at 2770w0d was admitted in Active Labor on 03/01/2016. Patient had an uncomplicated labor course as follows:  Membrane Rupture Time/Date: 2:10 PM ,03/01/2016   Intrapartum Procedures: Episiotomy: None [1]                                         Lacerations:  None [1]  Patient had a delivery of a Viable infant. 03/01/2016  Information for the patient's newborn:  Allen NorrisDiaz-Soto, Boy Everley [[id number]][[id number]]  Delivery Method: Vaginal, Spontaneous Delivery (Filed from Delivery Summary)    Pateint had an uncomplicated postpartum course.  She is ambulating, tolerating a regular diet, passing flatus, and urinating well. Patient is discharged home in stable condition on 03/02/16.    Physical exam Vitals:   03/01/16 1722 03/01/16 2115 03/01/16 2340 03/02/16 0627  BP: 116/61 (!) 95/54 105/68 (!) 116/57  Pulse: 66 88 67 67  Resp: 18 18 18 18   Temp: 97.7 F (36.5 C) 98.5 F (36.9 C) 97.5 F (36.4 C) 98.1 F (36.7 C)  TempSrc: Oral Oral Oral Oral  Weight:      Height:       General: alert, cooperative and no distress Lochia: appropriate Uterine Fundus: firm Incision: N/A DVT Evaluation: No evidence of DVT seen on physical exam. No significant calf/ankle edema. Labs: Lab Results  Component Value Date   WBC 15.4 (H) 03/01/2016   HGB 11.5 (L) 03/01/2016   HCT 35.5 (L) 03/01/2016   MCV 82.0 03/01/2016   PLT 220 03/01/2016   No flowsheet data found.  Discharge instruction: per After Visit Summary and "Baby and Me Booklet".  After visit meds:    Medication List    You have not been prescribed any medications.     Diet: routine diet  Activity: Advance as tolerated. Pelvic rest for 6 weeks.   Outpatient follow up:6 weeks Follow up Appt:No future appointments. Follow up Visit:No Follow-up on file.  Postpartum contraception: Undecided  Newborn Data: Live born female  Birth Weight: 6 lb 10.9 oz (3030 g) APGAR: 8, 9  Baby Feeding: Breast Disposition:home with mother   03/02/2016 Ernestina PennaNicholas Schenk, MD

## 2016-03-02 NOTE — Progress Notes (Signed)
Admission nutrition screen triggered for unintentional weight loss > 10 lbs within the last month. . Patients chart reviewed and assessed  for nutritional risk. Chart indicates a 5 lb weight gain over the past month.  Patient is determined to be at low nutrition  risk.   Elisabeth CaraKatherine Alic Hilburn M.Odis LusterEd. R.D. LDN Neonatal Nutrition Support Specialist/RD III Pager [phone]      Phone [phone]

## 2016-03-12 ENCOUNTER — Encounter: Payer: Self-pay | Admitting: Medical

## 2016-03-14 ENCOUNTER — Encounter: Payer: Self-pay | Admitting: *Deleted

## 2016-04-25 NOTE — Progress Notes (Signed)
Post discharge chart review completed.  

## 2016-05-08 ENCOUNTER — Ambulatory Visit (INDEPENDENT_AMBULATORY_CARE_PROVIDER_SITE_OTHER): Payer: Self-pay | Admitting: Advanced Practice Midwife

## 2016-05-08 ENCOUNTER — Encounter: Payer: Self-pay | Admitting: Advanced Practice Midwife

## 2016-05-08 DIAGNOSIS — Z124 Encounter for screening for malignant neoplasm of cervix: Secondary | ICD-10-CM

## 2016-05-08 DIAGNOSIS — Z3009 Encounter for other general counseling and advice on contraception: Secondary | ICD-10-CM

## 2016-05-08 NOTE — Patient Instructions (Signed)
Colocacin de un dispositivo intrauterino (Intrauterine Device Insertion) El dispositivo intrauterino (DIU) se inserta en el tero para Therapist, occupational. [employer] tipos de DIU:  DIU de cobre: este tipo de DIU crea un ambiente desfavorable para la supervivencia de los espermatozoides. El mecanismo de accin no se conoce con Media planner. Puede permanecer colocado durante 10 aos.  DIU hormonal: este tipo de DIU contiene la hormona progestina (progesterona sinttica). La progestina espesa el moco cervical y evita que los espermatozoides ingresen al tero y tambin afina la membrana que tapiza interiormente al tero. No hay evidencias de que el DIU hormonal evite la implantacin. Un DIU hormonal puede dejarse colocado durante un mximo de 66aos, y un DIU hormonal diferente, durante un mximo de 3aos. Es el mtodo anticonceptivo de mejor relacin entre el costo y la efectividad si se deja colocado todo el tiempo de su duracin. Se puede retirar en cualquier momento. INFORME A SU MDICO:  Cualquier alergia que tenga.  Todos los [employer] [place], incluyendo vitaminas, hierbas, gotas oftlmicas, cremas y medicamentos de venta libre.  Problemas previos que usted o los [employer] de su familia hayan tenido con el uso de anestsicos.  Enfermedades de [employer].  Cirugas previas.  Posible embarazo.  Enfermedades patolgicas. RIESGOS Y COMPLICACIONES  Generalmente, la colocacin de un dispositivo intrauterino es un procedimiento seguro. Sin embargo, Games developer procedimiento, pueden surgir complicaciones. Las complicaciones posibles son:  Pinchazo accidental (perforacin) del tero.  La colocacin accidental del DIU en la capa muscular del tero (miometrio) o fuera del tero. Si esto sucede, el DIU puede quedar flotando alrededor [employer] intestinos y debe extraerse quirrgicamente.  El DIU puede salirse del tero (expulsin). Esto es ms frecuente en las mujeres que han dado a luz  recientemente.  Embarazo en la trompa de [place] (ectpico).  Enfermedad plvica inflamatoria (EPI), una infeccin del tero y las trompas de [place]. Mayor riesgo de tener EPI durante los primeros 20das despus de la colocacin del DIU; sin embargo, el riesgo general sigue siendo muy bajo. ANTES DEL PROCEDIMIENTO  Programe la insercin del DIU para cuando tenga el perodo menstrual o inmediatamente despus, para asegurarse de que no est embarazada. La colocacin del DIU se tolera mejor poco despus del ciclo menstrual.  Podra necesitar anlisis o ser examinada para asegurarse de que no est embarazada.  Es posible que tenga que hacerse un test de [place].  Podrn indicarle anlisis para descartar enfermedades de transmisin sexual (ETS) antes de Programmer, systems. Colocar un DIU a una mujer que tiene una infeccin puede hacer que esta empeore.  Podrn indicarle que tome un analgsico 1 o 2 horas antes del procedimiento.  Se realiza un examen para determinar el tamao y la posicin del tero.  Consulte a su mdico si debe cambiar o suspender los medicamentos que toma habitualmente. PROCEDIMIENTO   Se coloca un instrumento (espculo) en la vagina que le permite a su mdico observar la parte inferior del tero (cuello del tero).  El cuello del tero se prepara con un medicamento que disminuye el riesgo de infeccin.  Tal vez le apliquen un medicamento para adormecer cada lado del cuello del tero (bloqueo intracervical o paracervical). Esto se realiza para evitar y Engineer, maintenance con la insercin.  Se insertar un instrumento (sonda uterina) en el tero para determinar la longitud de la cavidad uterina y la direccin hacia la cual el tero puede inclinarse.  Se coloca un dispositivo delgado (insertador del DIU) a travs del canal del  cuello del tero hasta el tero.  El DIU se coloca en la cavidad uterina y el dispositivo de insercin se retira.  Se recorta el hilo de  nylon que est unido al DIU y que se utiliza para su extraccin final. Se recorta de forma que quede en la vagina, apenas afuera del cuello uterino. DESPUS DEL PROCEDIMIENTO  Podr tener sangrado despus del procedimiento. Esto es normal. Vara desde un sangrado ligero durante un par de das hasta un sangrado similar al menstrual.  Podr sentir clicos leves.   Esta informacin no tiene como fin reemplazar el consejo del mdico. Asegrese de hacerle al mdico cualquier pregunta que tenga.   Document Released: 04/07/2012 Document Revised: 05/04/2013 Elsevier Interactive Patient Education 2016 Elsevier Inc.  

## 2016-05-08 NOTE — Addendum Note (Signed)
Addended by: Garret ReddishBARNES, Amiera Herzberg M on: 05/08/2016 05:04 PM   Modules accepted: Orders

## 2016-05-08 NOTE — Progress Notes (Signed)
Subjective:     Betty BoydenWendy Diaz-Soto is a 25 y.o. female who presents for a postpartum visit. She is 10 weeks postpartum following a spontaneous vaginal delivery. I have fully reviewed the prenatal and intrapartum course. The delivery was at 39 gestational weeks. Outcome: spontaneous vaginal delivery. Anesthesia: none. Postpartum course has been unremarkable. Baby's course has been unremarkable. Baby is feeding by both breast and bottle - Similac Advance. Bleeding no bleeding. Bowel function is normal. Bladder function is normal. Patient is sexually active. Contraception method is condoms. Postpartum depression screening: negative.  Last Pap 2016 per pt. Normal. Performed out of state. Records not available.  Spanish video interpreter "Alejandra" [phone] used for visit.  The following portions of the patient's history were reviewed and updated as appropriate: allergies, current medications, past family history, past medical history, past social history, past surgical history and problem list.  Review of Systems Pertinent items are noted in HPI.   Objective:    BP 106/68   Pulse 69   Wt 156 lb 6.4 oz (70.9 kg)   Breastfeeding? Yes   BMI 27.71 kg/m   General:  alert, cooperative and no distress   Breasts:  Declined exam  Lungs: clear to auscultation bilaterally  Heart:  regular rate and rhythm, S1, S2 normal, no murmur, click, rub or gallop  Abdomen: soft, non-tender; bowel sounds normal; no masses,  no organomegaly   Vulva:  normal  Vagina: normal vagina, no discharge, exudate, lesion, or erythema and scant bleeding  Cervix:  multiparous appearance, no cervical motion tenderness and scant bleeding after pap  Corpus: normal size, contour, position, consistency, mobility, non-tender  Adnexa:  no mass, fullness, tenderness  Rectal Exam: Not performed.        Assessment:     Normal postpartum exam. Pap smear done at today's visit.   Plan:    1. Contraception: IUD 2. IUD scholarship  given 3. Follow up in: PRN for IUD insertion

## 2016-05-12 LAB — CYTOLOGY - PAP: Diagnosis: NEGATIVE

## 2018-12-07 ENCOUNTER — Ambulatory Visit
Admission: RE | Admit: 2018-12-07 | Discharge: 2018-12-07 | Disposition: A | Discharge status: Home / Self Care | Payer: No Typology Code available for payment source | Source: Ambulatory Visit | Attending: Family Medicine | Admitting: Family Medicine

## 2018-12-07 ENCOUNTER — Other Ambulatory Visit: Payer: Self-pay | Admitting: Family Medicine

## 2018-12-07 ENCOUNTER — Other Ambulatory Visit: Payer: Self-pay

## 2018-12-07 DIAGNOSIS — R52 Pain, unspecified: Secondary | ICD-10-CM

## 2020-06-10 IMAGING — CR RIGHT INDEX FINGER 2+V
3 series · 3 of 3 positions shown · non-contrast
Comparison: None.

CLINICAL DATA: Pain

EXAM:
RIGHT INDEX FINGER 2+V

[x finger pa right]
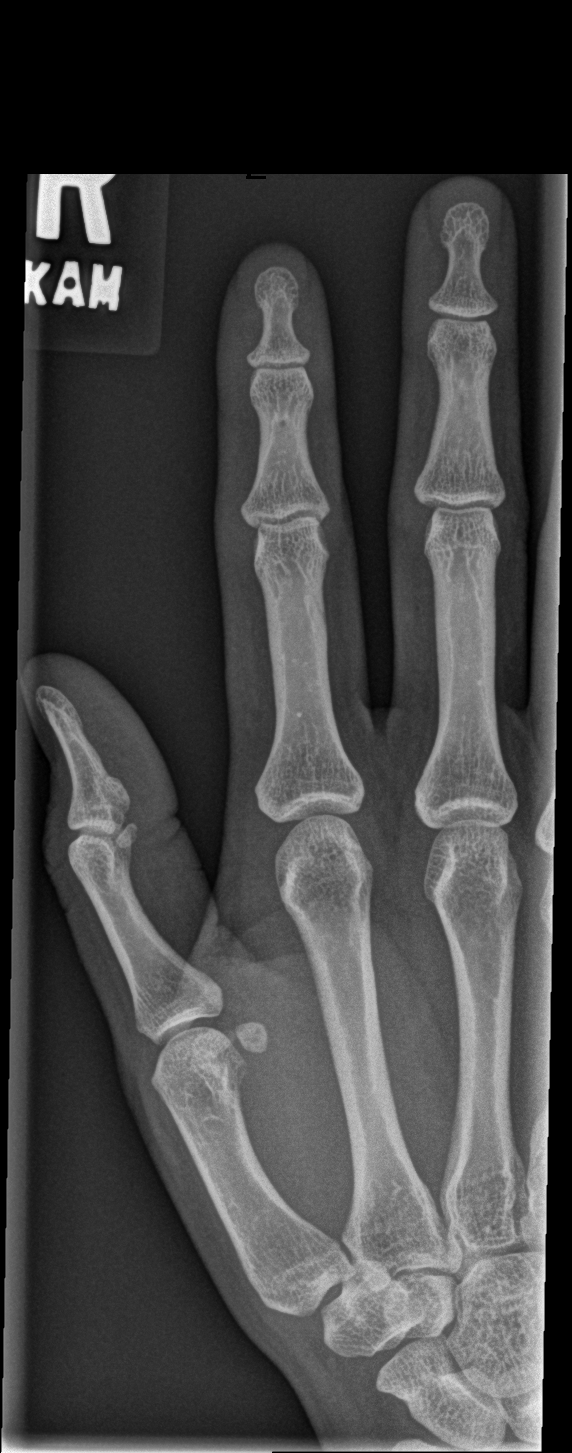

[x finger obl right]
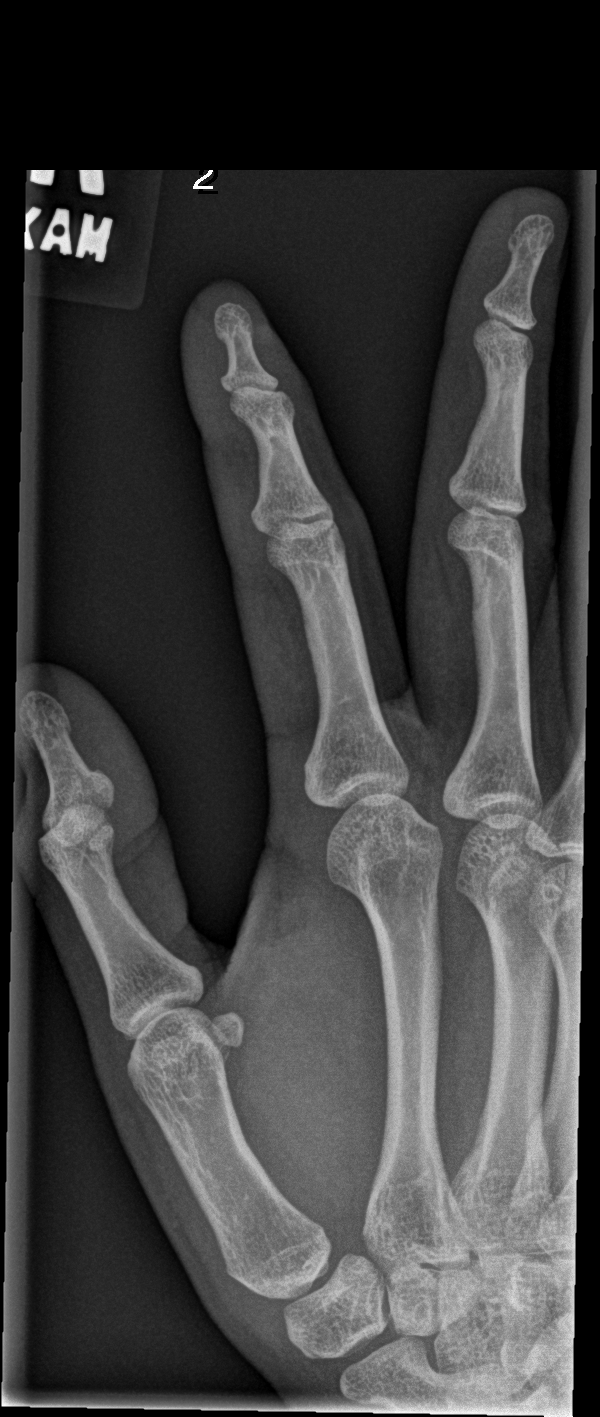

[x finger lat right]
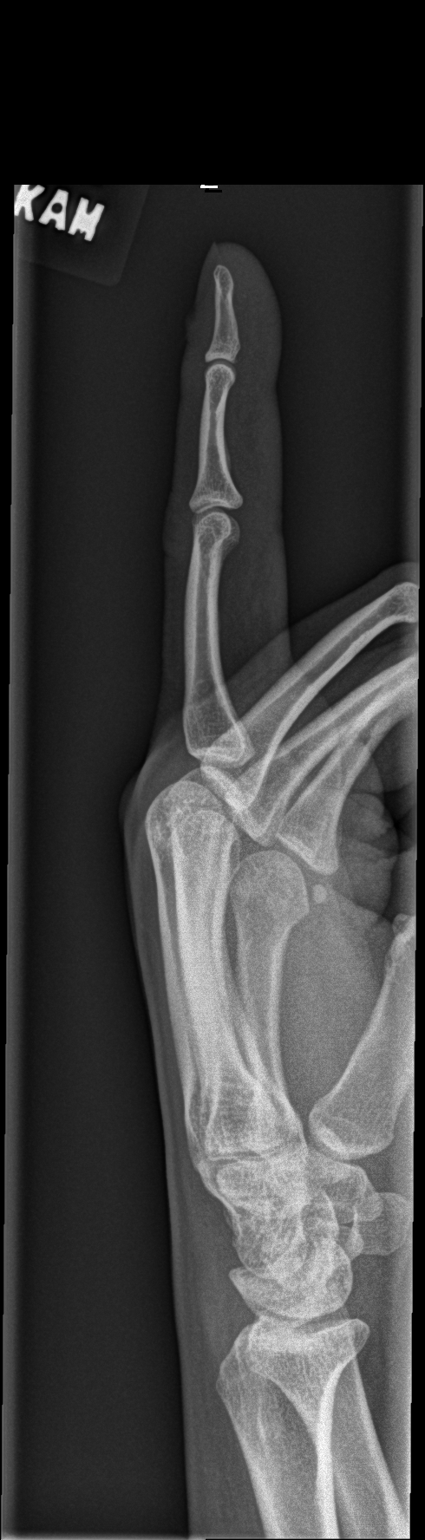

[3 of 3 positions shown; findings below may reference images not displayed]

FINDINGS: There is no evidence of fracture or dislocation. There is no
evidence of arthropathy or other focal bone abnormality. Soft
tissues are unremarkable.
IMPRESSION: Negative.

## 2021-04-25 ENCOUNTER — Ambulatory Visit: Payer: Self-pay | Admitting: Physician Assistant

## 2021-04-25 ENCOUNTER — Other Ambulatory Visit: Payer: Self-pay

## 2021-04-25 VITALS — BP 118/75 | HR 79 | Temp 98.2°F | Resp 18 | Ht 63.5 in | Wt 163.0 lb

## 2021-04-25 DIAGNOSIS — L80 Vitiligo: Secondary | ICD-10-CM

## 2021-04-25 DIAGNOSIS — Z0001 Encounter for general adult medical examination with abnormal findings: Secondary | ICD-10-CM

## 2021-04-25 DIAGNOSIS — Z6828 Body mass index (BMI) 28.0-28.9, adult: Secondary | ICD-10-CM

## 2021-04-25 DIAGNOSIS — Z1159 Encounter for screening for other viral diseases: Secondary | ICD-10-CM

## 2021-04-25 DIAGNOSIS — Z0000 Encounter for general adult medical examination without abnormal findings: Secondary | ICD-10-CM | POA: Insufficient documentation

## 2021-04-25 DIAGNOSIS — E663 Overweight: Secondary | ICD-10-CM

## 2021-04-25 HISTORY — DX: Vitiligo: L80

## 2021-04-25 HISTORY — DX: Encounter for general adult medical examination without abnormal findings: Z00.00

## 2021-04-25 NOTE — Patient Instructions (Signed)
Mantenimiento de la salud en las mujeres Health Maintenance, Female Adoptar un estilo de vida saludable y recibir atencin preventiva son importantes para promover la salud y el bienestar. Consulte al mdico sobre: El esquema adecuado para hacerse pruebas y exmenes peridicos. Cosas que puede hacer por su cuenta para prevenir enfermedades y mantenerse sana. Qu debo saber sobre la dieta, el peso y el ejercicio? Consuma una dieta saludable  Consuma una dieta que incluya muchas verduras, frutas, productos lcteos con bajo contenido de grasa y protenas magras. No consuma muchos alimentos ricos en grasas slidas, azcares agregados o sodio.  Mantenga un peso saludable El ndice de masa muscular (IMC) se utiliza para identificar problemas de peso. Proporciona una estimacin de la grasa corporal basndose en el peso y la altura. Su mdico puede ayudarle a determinar su IMC y a lograr o mantener unpeso saludable. Haga ejercicio con regularidad Haga ejercicio con regularidad. Esta es una de las prcticas ms importantes que puede hacer por su salud. La mayora de los adultos deben seguir estas pautas: Realizar, al menos, 150minutos de actividad fsica por semana. El ejercicio debe aumentar la frecuencia cardaca y hacerlo transpirar (ejercicio de intensidad moderada). Hacer ejercicios de fortalecimiento por lo menos dos veces por semana. Agregue esto a su plan de ejercicio de intensidad moderada. Pasar menos tiempo sentados. Incluso la actividad fsica ligera puede ser beneficiosa. Controle sus niveles de colesterol y lpidos en la sangre Comience a realizarse anlisis de lpidos y colesterol en la sangre a los20aos y luego reptalos cada 5aos. Hgase controlar los niveles de colesterol con mayor frecuencia si: Sus niveles de lpidos y colesterol son altos. Es mayor de 40aos. Presenta un alto riesgo de padecer enfermedades cardacas. Qu debo saber sobre las pruebas de deteccin del  cncer? Segn su historia clnica y sus antecedentes familiares, es posible que deba realizarse pruebas de deteccin del cncer en diferentes edades. Esto puede incluir pruebas de deteccin de lo siguiente: Cncer de mama. Cncer de cuello uterino. Cncer colorrectal. Cncer de piel. Cncer de pulmn. Qu debo saber sobre la enfermedad cardaca, la diabetes y la hipertensinarterial? Presin arterial y enfermedad cardaca La hipertensin arterial causa enfermedades cardacas y aumenta el riesgo de accidente cerebrovascular. Es ms probable que esto se manifieste en las personas que tienen lecturas de presin arterial alta, tienen ascendencia africana o tienen sobrepeso. Hgase controlar la presin arterial: Cada 3 a 5 aos si tiene entre 18 y 39 aos. Todos los aos si es mayor de 40aos. Diabetes Realcese exmenes de deteccin de la diabetes con regularidad. Este anlisis revisa el nivel de azcar en la sangre en ayunas. Hgase las pruebas de deteccin: Cada tresaos despus de los 40aos de edad si tiene un peso normal y un bajo riesgo de padecer diabetes. Con ms frecuencia y a partir de una edad inferior si tiene sobrepeso o un alto riesgo de padecer diabetes. Qu debo saber sobre la prevencin de infecciones? Hepatitis B Si tiene un riesgo ms alto de contraer hepatitis B, debe someterse a un examen de deteccin de este virus. Hable con el mdico para averiguar si tiene riesgode contraer la infeccin por hepatitis B. Hepatitis C Se recomienda el anlisis a: Todos los que nacieron entre 1945 y 1965. Todas las personas que tengan un riesgo de haber contrado hepatitis C. Enfermedades de transmisin sexual (ETS) Hgase las pruebas de deteccin de ITS, incluidas la gonorrea y la clamidia, si: Es sexualmente activa y es menor de 24aos. Es mayor de 24aos, y el mdico   le informa que corre riesgo de tener este tipo de infecciones. La actividad sexual ha cambiado desde que le  hicieron la ltima prueba de deteccin y tiene un riesgo mayor de tener clamidia o gonorrea. Pregntele al mdico si usted tiene riesgo. Pregntele al mdico si usted tiene un alto riesgo de contraer VIH. El mdico tambin puede recomendarle un medicamento recetado para ayudar a evitar la infeccin por el VIH. Si elige tomar medicamentos para prevenir el VIH, primero debe hacerse los anlisis de deteccin del VIH. Luego debe hacerse anlisis cada 3meses mientras est tomando los medicamentos. Embarazo Si est por dejar de menstruar (fase premenopusica) y usted puede quedar embarazada, busque asesoramiento antes de quedar embarazada. Tome de 400 a 800microgramos (mcg) de cido flico todos los das si queda embarazada. Pida mtodos de control de la natalidad (anticonceptivos) si desea evitar un embarazo no deseado. Osteoporosis y menopausia La osteoporosis es una enfermedad en la que los huesos pierden los minerales y la fuerza por el avance de la edad. El resultado pueden ser fracturas en los huesos. Si tiene 65aos o ms, o si est en riesgo de sufrir osteoporosis y fracturas, pregunte a su mdico si debe: Hacerse pruebas de deteccin de prdida sea. Tomar un suplemento de calcio o de vitamina D para reducir el riesgo de fracturas. Recibir terapia de reemplazo hormonal (TRH) para tratar los sntomas de la menopausia. Siga estas instrucciones en su casa: Estilo de vida No consuma ningn producto que contenga nicotina o tabaco, como cigarrillos, cigarrillos electrnicos y tabaco de mascar. Si necesita ayuda para dejar de fumar, consulte al mdico. No consuma drogas. No comparta agujas. Solicite ayuda a su mdico si necesita apoyo o informacin para abandonar las drogas. Consumo de alcohol No beba alcohol si: Su mdico le indica no hacerlo. Est embarazada, puede estar embarazada o est tratando de quedar embarazada. Si bebe alcohol: Limite la cantidad que consume de 0 a 1 medida por  da. Limite la ingesta si est amamantando. Est atento a la cantidad de alcohol que hay en las bebidas que toma. En los [place], una medida equivale a una botella de cerveza de 12oz (355ml), un vaso de vino de 5oz (148ml) o un vaso de una bebida alcohlica de alta graduacin de 1oz (44ml). Instrucciones generales Realcese los estudios de rutina de la salud, dentales y de la vista. Mantngase al da con las vacunas. Infrmele a su mdico si: Se siente deprimida con frecuencia. Alguna vez ha sido vctima de maltrato o no se siente segura en su casa. Resumen Adoptar un estilo de vida saludable y recibir atencin preventiva son importantes para promover la salud y el bienestar. Siga las instrucciones del mdico acerca de una dieta saludable, el ejercicio y la realizacin de pruebas o exmenes para detectar enfermedades. Siga las instrucciones del mdico con respecto al control del colesterol y la presin arterial. Esta informacin no tiene como fin reemplazar el consejo del mdico. Asegresede hacerle al mdico cualquier pregunta que tenga. Document Revised: 08/04/2018 Document Reviewed: 08/04/2018 Elsevier Patient Education  2022 Elsevier Inc.  

## 2021-04-25 NOTE — Progress Notes (Signed)
Patient has eaten today. Patient has eaten today. Patient not taken medication today. Patient denies pain at this time.

## 2021-04-25 NOTE — Progress Notes (Signed)
New Patient Office Visit  Subjective:  Patient ID: Betty [name], female    DOB: December 27, 1990  Age: 30 y.o. MRN: [id number]  CC:  Chief Complaint  Patient presents with   Annual Exam     HPI Betty [name] presents for a health physical.  Reports that she has been unable to see a medical provider in 4 years due to lack of insurance.  Reports that she does have an IUD in place, states it has been there for 4 years and due to be removed in 1 year.  Reports that she has had vitiligo since she was a child.  Reports that she has been treated with many different medications that did not offer any relief.  Was last seen by [facility] dermatology in 2019.  Sleep is good, appetite is good, no other concerns at this time.   History reviewed. No pertinent past medical history.  History reviewed. No pertinent surgical history.  History reviewed. No pertinent family history.  Social History   Socioeconomic History   Marital status: Single    Spouse name: Not on file   Number of children: Not on file   Years of education: Not on file   Highest education level: Not on file  Occupational History   Not on file  Tobacco Use   Smoking status: Never   Smokeless tobacco: Never  Substance and Sexual Activity   Alcohol use: No   Drug use: No   Sexual activity: Yes    Birth control/protection: I.U.D.  Other Topics Concern   Not on file  Social History Narrative   Not on file   Social Determinants of Health   Financial Resource Strain: Not on file  Food Insecurity: Not on file  Transportation Needs: Not on file  Physical Activity: Not on file  Stress: Not on file  Social Connections: Not on file  Intimate Partner Violence: Not on file    ROS Review of Systems  Constitutional: Negative.   HENT: Negative.    Eyes: Negative.   Respiratory:  Negative for shortness of breath.   Cardiovascular:  Negative for chest pain.  Gastrointestinal:  Negative for abdominal pain.  Endocrine:  Negative.   Genitourinary: Negative.   Musculoskeletal: Negative.   Skin:  Positive for color change.  Allergic/Immunologic: Negative.   Neurological: Negative.   Hematological: Negative.   Psychiatric/Behavioral: Negative.     Objective:   Today's Vitals: BP 118/75 (BP Location: Left Arm, Patient Position: Sitting, Cuff Size: Normal)   Pulse 79   Temp 98.2 F (36.8 C) (Oral)   Resp 18   Ht 5' 3.5" (1.613 m)   Wt 163 lb (73.9 kg)   LMP 03/21/2021   SpO2 100%   BMI 28.42 kg/m   Physical Exam Vitals and nursing note reviewed.  Constitutional:      Appearance: Normal appearance.  HENT:     Head: Normocephalic and atraumatic.     Right Ear: Tympanic membrane, ear canal and external ear normal.     Left Ear: Tympanic membrane, ear canal and external ear normal.     Nose: Nose normal.     Mouth/Throat:     Mouth: Mucous membranes are moist.     Pharynx: Oropharynx is clear.  Eyes:     Extraocular Movements: Extraocular movements intact.     Conjunctiva/sclera: Conjunctivae normal.     Pupils: Pupils are equal, round, and reactive to light.  Cardiovascular:     Rate and Rhythm: Normal rate and regular  rhythm.     Pulses: Normal pulses.     Heart sounds: Normal heart sounds.  Pulmonary:     Effort: Pulmonary effort is normal.     Breath sounds: Normal breath sounds.  Musculoskeletal:        General: Normal range of motion.     Cervical back: Normal range of motion and neck supple.  Skin:    General: Skin is warm and dry.     Comments: Large areas  of depigmented skin  Neurological:     General: No focal deficit present.     Mental Status: She is alert.  Psychiatric:        Mood and Affect: Mood normal.        Behavior: Behavior normal.        Thought Content: Thought content normal.        Judgment: Judgment normal.    Assessment & Plan:   Problem List Items Addressed This Visit       Musculoskeletal and Integument   Vitiligo     Other   Wellness  examination - Primary   Relevant Orders   CBC with Differential/Platelet   Comp. Metabolic Panel (12)   TSH   Other Visit Diagnoses     Encounter for HCV screening test for low risk patient       Relevant Orders   HCV Ab w Reflex to Quant PCR   Overweight with body mass index (BMI) of 28 to 28.9 in adult           No outpatient encounter medications on file as of 04/25/2021.   No facility-administered encounter medications on file as of 04/25/2021.   1. Wellness examination Patient education given on general health maintenance.  Patient was given an appointment to establish care with Dr. Andrey Campanile at [facility] on June 03, 2021.  Patient was given application for [facility] health financial assistance. - CBC with Differential/Platelet - Comp. Metabolic Panel (12) - TSH  2. Vitiligo   3. Encounter for HCV screening test for low risk patient  - HCV Ab w Reflex to Quant PCR  4. Overweight with body mass index (BMI) of 28 to 28.9 in adult   I have reviewed the patient's medical history (PMH, PSH, Social History, Family History, Medications, and allergies) , and have been updated if relevant. I spent 23 minutes reviewing chart and  face to face time with patient.    Follow-up: Return in about 6 weeks (around 06/03/2021) for with Dr. Andrey Campanile at [facility].   Kasandra Knudsen Mayers, PA-C

## 2021-04-26 LAB — TSH: TSH: 1.62 u[IU]/mL (ref 0.450–4.500)

## 2021-04-26 LAB — CBC WITH DIFFERENTIAL/PLATELET
Basophils Absolute: 0 10*3/uL (ref 0.0–0.2)
Basos: 0 %
EOS (ABSOLUTE): 0.4 10*3/uL (ref 0.0–0.4)
Eos: 5 %
Hematocrit: 42.8 % (ref 34.0–46.6)
Hemoglobin: 14.1 g/dL (ref 11.1–15.9)
Immature Grans (Abs): 0 10*3/uL (ref 0.0–0.1)
Immature Granulocytes: 0 %
Lymphocytes Absolute: 2.4 10*3/uL (ref 0.7–3.1)
Lymphs: 29 %
MCH: 28 pg (ref 26.6–33.0)
MCHC: 32.9 g/dL (ref 31.5–35.7)
MCV: 85 fL (ref 79–97)
Monocytes Absolute: 0.5 10*3/uL (ref 0.1–0.9)
Monocytes: 6 %
Neutrophils Absolute: 5 10*3/uL (ref 1.4–7.0)
Neutrophils: 60 %
Platelets: 260 10*3/uL (ref 150–450)
RBC: 5.03 x10E6/uL (ref 3.77–5.28)
RDW: 13.1 % (ref 11.7–15.4)
WBC: 8.3 10*3/uL (ref 3.4–10.8)

## 2021-04-26 LAB — COMP. METABOLIC PANEL (12)
AST: 32 IU/L (ref 0–40)
Albumin/Globulin Ratio: 1.7 (ref 1.2–2.2)
Albumin: 4.5 g/dL (ref 3.8–4.8)
Alkaline Phosphatase: 74 IU/L (ref 44–121)
BUN/Creatinine Ratio: 18 (ref 9–23)
BUN: 12 mg/dL (ref 6–20)
Bilirubin Total: 0.5 mg/dL (ref 0.0–1.2)
Calcium: 9.5 mg/dL (ref 8.7–10.2)
Chloride: 102 mmol/L (ref 96–106)
Creatinine, Ser: 0.66 mg/dL (ref 0.57–1.00)
Globulin, Total: 2.7 g/dL (ref 1.5–4.5)
Glucose: 103 mg/dL — ABNORMAL HIGH (ref 70–99)
Potassium: 4 mmol/L (ref 3.5–5.2)
Sodium: 138 mmol/L (ref 134–144)
Total Protein: 7.2 g/dL (ref 6.0–8.5)
eGFR: 119 mL/min/{1.73_m2} (ref >59)

## 2021-04-26 LAB — HCV AB W REFLEX TO QUANT PCR: HCV Ab: <0.1 s/co ratio (ref 0.0–0.9)

## 2021-04-26 LAB — HCV INTERPRETATION

## 2021-04-29 ENCOUNTER — Telehealth: Payer: Self-pay | Admitting: *Deleted

## 2021-04-29 NOTE — Telephone Encounter (Signed)
Medical Assistant used [employer] to contact patient.  Interpreter Name: Roberto Interpreter #: [id number] Patient was not available, [employer] left patient a voicemail. Voicemail states to give a call back to Kule Gascoigne with [facility] at [phone] 

## 2021-04-29 NOTE — Telephone Encounter (Signed)
-----   Message from Roney Jaffe, [place] sent at 04/29/2021  8:51 AM EDT ----- Please call patient and let her know that her thyroid function, kidney and liver function are within normal limits.  She does not show signs of anemia.  Her screening for hepatitis C was negative.

## 2021-05-08 ENCOUNTER — Other Ambulatory Visit: Payer: Self-pay

## 2021-05-08 ENCOUNTER — Ambulatory Visit: Payer: No Typology Code available for payment source | Attending: Family Medicine

## 2021-05-21 ENCOUNTER — Telehealth: Payer: Self-pay

## 2021-05-21 NOTE — Telephone Encounter (Signed)
Pt was sent a letter from financial dept. Inform them, that the application they submitted was incomplete, since they were missing some documentation at the time of the appointment, Pt need to reschedule and resubmit all new papers and application for [employer] and [facility], P.S. old documents has been sent back by mail to the Pt and Pt. need to make a new appt. 

## 2021-06-03 ENCOUNTER — Ambulatory Visit: Payer: No Typology Code available for payment source | Admitting: Family Medicine

## 2021-07-25 ENCOUNTER — Ambulatory Visit
Admission: EM | Admit: 2021-07-25 | Discharge: 2021-07-25 | Disposition: A | Discharge status: Home / Self Care | Payer: No Typology Code available for payment source | Attending: Physician Assistant | Admitting: Physician Assistant

## 2021-07-25 ENCOUNTER — Other Ambulatory Visit: Payer: Self-pay

## 2021-07-25 ENCOUNTER — Encounter: Payer: Self-pay | Admitting: Physician Assistant

## 2021-07-25 DIAGNOSIS — N760 Acute vaginitis: Secondary | ICD-10-CM

## 2021-07-25 MED ORDER — PREDNISONE 20 MG PO TABS: 40.0000 mg | ORAL_TABLET | Freq: Every day | ORAL | 0 refills | Status: AC

## 2021-07-25 NOTE — ED Triage Notes (Addendum)
Vaginal itching and burning, externally x 1 month. Went to clinic, was given fluconazole without improvement. Also tried vagisil cream, minor improvement with that, but it has since stopped working. Denies any new unprotect sex. [name], has been there x 5 years.

## 2021-07-25 NOTE — ED Provider Notes (Signed)
[facility]    CSN: [id number] Arrival date & time: 07/25/21  1615      History   Chief Complaint Chief Complaint  Patient presents with   Vaginal Itching    HPI Betty [name] is a 30 y.o. female.   Patient here today for evaluation of vaginal itching that has been ongoing for several weeks.  She states that she has been seen and has been prescribed Diflucan without significant relief.  She reports that her last prescription was for 6 doses and symptoms did not clear.  She reports she continues to have vaginal itching and irritation.  She has tried using badges still but this seems to irritate area more.  She has had STD screening with negative results.  She denies any use of topical soaps, fragrance, lotions etc.  She does report that she has had new underwear purchased before symptoms started and did not wash before wearing.  The history is provided by the patient. The history is limited by a language barrier. A language interpreter was used Banker).  Vaginal Itching Pertinent negatives include no abdominal pain and no shortness of breath.   History reviewed. No pertinent past medical history.  Patient Active Problem List   Diagnosis Date Noted   Wellness examination 04/25/2021   Vitiligo 04/25/2021    History reviewed. No pertinent surgical history.  OB History     Gravida  3   Para  2   Term  2   Preterm      AB      Living  2      SAB      IAB      Ectopic      Multiple  0   Live Births  2            Home Medications    Prior to Admission medications   Medication Sig Start Date End Date Taking? Authorizing Provider  predniSONE (DELTASONE) 20 MG tablet Take 2 tablets (40 mg total) by mouth daily with breakfast for 5 days. 07/25/21 07/30/21 Yes Tomi Bamberger, PA-C    Family History History reviewed. No pertinent family history.  Social History Social History   Tobacco Use   Smoking status: Never   Smokeless  tobacco: Never  Substance Use Topics   Alcohol use: No   Drug use: No     Allergies   Patient has no known allergies.   Review of Systems Review of Systems  Constitutional:  Negative for chills and fever.  Eyes:  Negative for discharge and redness.  Respiratory:  Negative for shortness of breath.   Gastrointestinal:  Negative for abdominal pain, nausea and vomiting.  Genitourinary:  Negative for vaginal discharge.       Positive for vaginal itching    Physical Exam Triage Vital Signs ED Triage Vitals  Enc Vitals Group     BP 07/25/21 1720 117/75     Pulse Rate 07/25/21 1720 79     Resp 07/25/21 1720 16     Temp 07/25/21 1720 98.2 F (36.8 C)     Temp Source 07/25/21 1720 Oral     SpO2 07/25/21 1720 98 %     Weight --      Height --      Head Circumference --      Peak Flow --      Pain Score 07/25/21 1725 10     Pain Loc --      Pain Edu? --  Excl. in GC? --    No data found.  Updated Vital Signs BP 117/75 (BP Location: Left Arm)    Pulse 79    Temp 98.2 F (36.8 C) (Oral)    Resp 16    SpO2 98%    Physical Exam Vitals and nursing note reviewed.  Constitutional:      General: She is not in acute distress.    Appearance: Normal appearance. She is not ill-appearing.  HENT:     Head: Normocephalic and atraumatic.  Eyes:     Conjunctiva/sclera: Conjunctivae normal.  Cardiovascular:     Rate and Rhythm: Normal rate.  Pulmonary:     Effort: Pulmonary effort is normal.  Neurological:     Mental Status: She is alert.  Psychiatric:        Mood and Affect: Mood normal.        Behavior: Behavior normal.        Thought Content: Thought content normal.     UC Treatments / Results  Labs (all labs ordered are listed, but only abnormal results are displayed) Labs Reviewed - No data to display  EKG   Radiology No results found.  Procedures Procedures (including critical care time)  Medications Ordered in UC Medications - No data to  display  Initial Impression / Assessment and Plan / UC Course  I have reviewed the triage vital signs and the nursing notes.  Pertinent labs & imaging results that were available during my care of the patient were reviewed by me and considered in my medical decision making (see chart for details).    Suspect likely skin irritation as cause of symptoms and will treat with steroids to hopefully decrease inflammation and improve symptoms.  Advised to use Vaseline and avoid Vagisil as this seems to be worsening symptoms.  Recommended follow-up if no improvement.  Final Clinical Impressions(s) / UC Diagnoses   Final diagnoses:  Vaginitis and vulvovaginitis   Discharge Instructions   None    ED Prescriptions     Medication Sig Dispense Auth. Provider   predniSONE (DELTASONE) 20 MG tablet Take 2 tablets (40 mg total) by mouth daily with breakfast for 5 days. 10 tablet Tomi Bamberger, PA-C      PDMP not reviewed this encounter.   Tomi Bamberger, PA-C 07/25/21 1755

## 2023-07-29 NOTE — L&D Delivery Note (Signed)
 Delivery Note ROM x 0h 41m. At 11:55 AM a viable and healthy female was delivered via Vaginal, Spontaneous (Presentation: Left Occiput Anterior). Shoulders delivered easily. Infant placed skin-to-skin w/ mom. Delayed cord clamping x 1 minute. Cord clamped x 2 and cut by FOB. APGAR: 8, 9; weight pending.   Placenta status: Spontaneous, Intact.  Cord: 3 vessels with the following complications: Short.  Cord pH: NA.  Lost IV access while pushing. IV pitocin  given. FF. Scant bleeding after delivery of placenta. Will hold off on replacing IV for now but will CTO closely. Explained that one would need to be placed if she has BTL.   Anesthesia: None Episiotomy: None Lacerations: None Suture Repair: NA Est. Blood Loss (mL):   Mom to postpartum.  Baby to Couplet care / Skin to [facility]. Placenta to: LD Feeding: Breast Circ: NA Contraception: Plans BTL. Only paid part. Will discuss after pt has bonded with baby.    Nadeem Romanoski  Claudene 05/04/2024, 12:20 PM

## 2023-09-25 ENCOUNTER — Other Ambulatory Visit: Payer: Self-pay

## 2023-09-25 ENCOUNTER — Emergency Department (HOSPITAL_COMMUNITY): Payer: No Typology Code available for payment source

## 2023-09-25 ENCOUNTER — Emergency Department (HOSPITAL_COMMUNITY)
Admission: EM | Admit: 2023-09-25 | Discharge: 2023-09-26 | Disposition: A | Discharge status: Home / Self Care | Payer: No Typology Code available for payment source | Attending: Emergency Medicine | Admitting: Emergency Medicine

## 2023-09-25 ENCOUNTER — Encounter (HOSPITAL_COMMUNITY): Payer: Self-pay

## 2023-09-25 DIAGNOSIS — O99891 Other specified diseases and conditions complicating pregnancy: Secondary | ICD-10-CM

## 2023-09-25 DIAGNOSIS — R8271 Bacteriuria: Secondary | ICD-10-CM

## 2023-09-25 DIAGNOSIS — Z3A08 8 weeks gestation of pregnancy: Secondary | ICD-10-CM | POA: Insufficient documentation

## 2023-09-25 DIAGNOSIS — L292 Pruritus vulvae: Secondary | ICD-10-CM | POA: Insufficient documentation

## 2023-09-25 DIAGNOSIS — O2391 Unspecified genitourinary tract infection in pregnancy, first trimester: Secondary | ICD-10-CM | POA: Insufficient documentation

## 2023-09-25 LAB — RAPID HIV SCREEN (HIV 1/2 AB+AG)
HIV 1/2 Antibodies: NONREACTIVE
HIV-1 P24 Antigen - HIV24: NONREACTIVE

## 2023-09-25 LAB — HCG, QUANTITATIVE, PREGNANCY: hCG, Beta Chain, Quant, S: 65754 m[IU]/mL — ABNORMAL HIGH (ref <5)

## 2023-09-25 LAB — ABO/RH: ABO/RH(D): O POS

## 2023-09-25 LAB — OB RESULTS CONSOLE HIV ANTIBODY (ROUTINE TESTING): HIV: NONREACTIVE

## 2023-09-25 NOTE — ED Provider Triage Note (Signed)
 Emergency Medicine Provider Triage Evaluation Note  Betty [name] , a 33 y.o. female  was evaluated in triage.  Pt complains of pelvic pain. Pt is [redacted] weeks pregnant.  Having intermittent pelvic pain, vaginal spotting, vaginal itchiness x2-3 days.  No fever, n/v/d.  No vaginal discharge.  Recently diagnosed with BV  Review of Systems  Positive: As above Negative: As above  Physical Exam  BP 136/81 (BP Location: Left Arm)   Pulse 85   Temp 98.3 F (36.8 C) (Oral)   Resp 18   Ht 5' 3.5" (1.613 m)   Wt 73.9 kg   LMP 07/29/2023   SpO2 100%   BMI 28.41 kg/m  Gen:   Awake, no distress   Resp:  Normal effort  MSK:   Moves extremities without difficulty  Other:    Medical Decision Making  Medically screening exam initiated at 4:47 PM.  Appropriate orders placed.  Betty [name] was informed that the remainder of the evaluation will be completed by another provider, this initial triage assessment does not replace that evaluation, and the importance of remaining in the ED until their evaluation is complete.     Fayrene Helper, PA-C 09/25/23 1650

## 2023-09-25 NOTE — ED Triage Notes (Signed)
 Patient is here for evaluation of abdominal pain and vaginal itching. Reports being seen at [facility] and diagnosed with vaginal bacteria, she states she was given a vaginal cream for treatment. Pt reports approximately [redacted] weeks pregnant. Reports lower abdominal pain across entire abdomen. Denies any vaginal discharge, but does report some vaginal itching still.

## 2023-09-26 LAB — URINALYSIS, ROUTINE W REFLEX MICROSCOPIC
Bilirubin Urine: NEGATIVE
Glucose, UA: NEGATIVE mg/dL
Hgb urine dipstick: NEGATIVE
Ketones, ur: NEGATIVE mg/dL
Nitrite: NEGATIVE
Protein, ur: NEGATIVE mg/dL
Specific Gravity, Urine: 1.026 (ref 1.005–1.030)
pH: 6 (ref 5.0–8.0)

## 2023-09-26 LAB — WET PREP, GENITAL
Clue Cells Wet Prep HPF POC: NONE SEEN
Sperm: NONE SEEN
Trich, Wet Prep: NONE SEEN
WBC, Wet Prep HPF POC: <10 (ref <10)
Yeast Wet Prep HPF POC: NONE SEEN

## 2023-09-26 LAB — RPR: RPR Ser Ql: NONREACTIVE

## 2023-09-26 MED ORDER — NITROFURANTOIN MONOHYD MACRO 100 MG PO CAPS: 100.0000 mg | ORAL_CAPSULE | Freq: Two times a day (BID) | ORAL | 0 refills | Status: DC

## 2023-09-26 NOTE — Discharge Instructions (Signed)
 I have prescribed an antibiotic.  Please take until complete.  Follow-up with your primary team for further evaluation and prenatal care.

## 2023-09-26 NOTE — ED Provider Notes (Signed)
 [place] EMERGENCY DEPARTMENT AT [facility] Provider Note   CSN: [id number] Arrival date & time: 09/25/23  1614     History  Chief Complaint  Patient presents with   Abdominal Pain    Betty [name] is a 33 y.o. female. Patient with no significant medical history, currently [redacted] weeks pregnant, presents to the emergency department complaining of lower abdominal pain. Pain is reported as mild. Patient does report mild vaginal itching, currently being followed by the [facility] Department. No vaginal bleeding or discharge provided.    Abdominal Pain      Home Medications Prior to Admission medications   Medication Sig Start Date End Date Taking? Authorizing Provider  nitrofurantoin, macrocrystal-monohydrate, (MACROBID) 100 MG capsule Take 1 capsule (100 mg total) by mouth 2 (two) times daily. 09/26/23  Yes Darrick Grinder, PA-C      Allergies    Patient has no known allergies.    Review of Systems   Review of Systems  Gastrointestinal:  Positive for abdominal pain.    Physical Exam Updated Vital Signs BP (!) 111/57   Pulse 70   Temp 97.8 F (36.6 C) (Oral)   Resp 17   Ht 5' 3.5" (1.613 m)   Wt 73.9 kg   LMP 07/29/2023   SpO2 99%   BMI 28.41 kg/m  Physical Exam Vitals and nursing note reviewed.  HENT:     Head: Normocephalic and atraumatic.  Eyes:     Pupils: Pupils are equal, round, and reactive to light.  Cardiovascular:     Rate and Rhythm: Normal rate and regular rhythm.  Pulmonary:     Effort: Pulmonary effort is normal. No respiratory distress.     Breath sounds: Normal breath sounds.  Abdominal:     Tenderness: There is no abdominal tenderness.  Musculoskeletal:        General: No signs of injury.     Cervical back: Normal range of motion.  Skin:    General: Skin is dry.  Neurological:     Mental Status: She is alert.  Psychiatric:        Speech: Speech normal.        Behavior: Behavior normal.     ED Results /  Procedures / Treatments   Labs (all labs ordered are listed, but only abnormal results are displayed) Labs Reviewed  URINALYSIS, ROUTINE W REFLEX MICROSCOPIC - Abnormal; Notable for the following components:      Result Value   APPearance HAZY (*)    Leukocytes,Ua TRACE (*)    Bacteria, UA FEW (*)    All other components within normal limits  HCG, QUANTITATIVE, PREGNANCY - Abnormal; Notable for the following components:   hCG, Beta Chain, Quant, S 65,754 (*)    All other components within normal limits  WET PREP, GENITAL  RAPID HIV SCREEN (HIV 1/2 AB+AG)  RPR  ABO/RH  GC/CHLAMYDIA PROBE AMP ([place]) NOT AT [facility]    EKG None  Radiology US OB Comp < 14 Wks Result Date: 09/25/2023 CLINICAL DATA:  Pelvic pain and spotting. EXAM: OBSTETRIC <14 WK [place] AND TRANSVAGINAL OB US TECHNIQUE: Both transabdominal and transvaginal ultrasound examinations were performed for complete evaluation of the gestation as well as the maternal uterus, adnexal regions, and pelvic cul-de-sac. Transvaginal technique was performed to assess early pregnancy. COMPARISON:  None Available. FINDINGS: Intrauterine gestational sac: Single Yolk sac:  Visualized. Embryo:  Visualized. Cardiac Activity: Visualized. Heart Rate: 151 bpm CRL:  10.2 mm  7 w   1 d                  [place] EDC: May 12, 2024 Subchorionic hemorrhage:  None visualized. Maternal uterus/adnexae: The bilateral ovaries are visualized and are normal in appearance. No pelvic free fluid is seen. IMPRESSION: Single, viable intrauterine pregnancy at approximately 7 weeks and 1 day gestation by ultrasound evaluation. Electronically Signed   By: Aram Candela M.D.   On: 09/25/2023 20:11   US OB Transvaginal Result Date: 09/25/2023 CLINICAL DATA:  Pelvic pain and spotting. EXAM: OBSTETRIC <14 WK [place] AND TRANSVAGINAL OB US TECHNIQUE: Both transabdominal and transvaginal ultrasound examinations were performed for complete evaluation of the gestation as well as  the maternal uterus, adnexal regions, and pelvic cul-de-sac. Transvaginal technique was performed to assess early pregnancy. COMPARISON:  None Available. FINDINGS: Intrauterine gestational sac: Single Yolk sac:  Visualized. Embryo:  Visualized. Cardiac Activity: Visualized. Heart Rate: 151 bpm CRL:  10.2 mm   7 w   1 d                  [place] EDC: May 12, 2024 Subchorionic hemorrhage:  None visualized. Maternal uterus/adnexae: The bilateral ovaries are visualized and are normal in appearance. No pelvic free fluid is seen. IMPRESSION: Single, viable intrauterine pregnancy at approximately 7 weeks and 1 day gestation by ultrasound evaluation. Electronically Signed   By: Aram Candela M.D.   On: 09/25/2023 20:11    Procedures Procedures    Medications Ordered in ED Medications - No data to display  ED Course/ Medical Decision Making/ A&P                                 Medical Decision Making Risk Prescription drug management.   This patient presents to the ED for concern of abdominal discomfort, this involves an extensive number of treatment options, and is a complaint that carries with it a high risk of complications and morbidity.  The differential diagnosis includes gastritis, cystitis, spontaneous abortion, others   Co morbidities that complicate the patient evaluation  Current pregnancy   Additional history obtained:  Additional history obtained from significant other at bedside   Lab Tests:  I Ordered, and personally interpreted labs.  The pertinent results include:  beta hcg 65,754; UA with few bacteria and trace leukocytes   Imaging Studies ordered:  I ordered imaging studies including OB ultrasound  I independently visualized and interpreted imaging which showed  Single, viable intrauterine pregnancy at approximately 7 weeks and 1  day gestation by ultrasound evaluation.   I agree with the radiologist interpretation   Social Determinants of Health:  Patient  is self pay, gets care through the health department   Test / Admission - Considered:  Patient with small amount of bacteria on UA.  No other acute findings today.  Plan to discharge home with prescription for Macrobid.  Patient to follow-up with her primary team/health department for further prenatal care and evaluation.         Final Clinical Impression(s) / ED Diagnoses Final diagnoses:  Bacteriuria during pregnancy    Rx / DC Orders ED Discharge Orders          Ordered    nitrofurantoin, macrocrystal-monohydrate, (MACROBID) 100 MG capsule  2 times daily        09/26/23 0424              Yousra Ivens,  Dorisann Frames, PA-C 09/26/23 1610    Nicanor Alcon, April, MD 09/26/23 [id number]

## 2023-09-28 LAB — GC/CHLAMYDIA PROBE AMP (~~LOC~~) NOT AT ARMC
Chlamydia: NEGATIVE
Comment: NEGATIVE
Comment: NORMAL
Neisseria Gonorrhea: NEGATIVE

## 2023-10-19 LAB — OB RESULTS CONSOLE ANTIBODY SCREEN: Antibody Screen: NEGATIVE

## 2023-10-19 LAB — OB RESULTS CONSOLE VARICELLA ZOSTER ANTIBODY, IGG: Varicella: IMMUNE

## 2023-10-19 LAB — HEPATITIS C ANTIBODY: HCV Ab: NEGATIVE

## 2023-10-19 LAB — OB RESULTS CONSOLE HEPATITIS B SURFACE ANTIGEN: Hepatitis B Surface Ag: NEGATIVE

## 2023-10-19 LAB — URINE CULTURE
Drug Screen, Urine: NEGATIVE
Glucose 1 Hour: 162
Urine Culture, OB: NEGATIVE

## 2023-10-19 LAB — OB RESULTS CONSOLE HGB/HCT, BLOOD
HCT: 36 (ref 29–41)
Hemoglobin: 12

## 2023-10-19 LAB — OB RESULTS CONSOLE PLATELET COUNT: Platelets: 271

## 2023-10-19 LAB — OB RESULTS CONSOLE RUBELLA ANTIBODY, IGM: Rubella: IMMUNE

## 2023-10-23 LAB — GLUCOSE, 3 HOUR GESTATIONAL
Glucose 1 Hour: 177
Glucose 2 Hour: 163
Glucose 3 Hour: 133
Glucose, Fasting: 83

## 2023-10-23 LAB — OB RESULTS CONSOLE ABO/RH: RH Type: POSITIVE

## 2023-12-01 ENCOUNTER — Other Ambulatory Visit: Payer: Self-pay | Admitting: Obstetrics & Gynecology

## 2023-12-01 DIAGNOSIS — Z363 Encounter for antenatal screening for malformations: Secondary | ICD-10-CM

## 2023-12-30 DIAGNOSIS — O09529 Supervision of elderly multigravida, unspecified trimester: Secondary | ICD-10-CM | POA: Insufficient documentation

## 2024-01-07 ENCOUNTER — Ambulatory Visit: Payer: Self-pay | Attending: Obstetrics & Gynecology

## 2024-01-07 ENCOUNTER — Ambulatory Visit: Payer: Self-pay | Admitting: Obstetrics and Gynecology

## 2024-01-07 ENCOUNTER — Other Ambulatory Visit: Payer: Self-pay | Admitting: *Deleted

## 2024-01-07 VITALS — BP 108/56 | HR 71

## 2024-01-07 DIAGNOSIS — Z362 Encounter for other antenatal screening follow-up: Secondary | ICD-10-CM

## 2024-01-07 DIAGNOSIS — O09522 Supervision of elderly multigravida, second trimester: Secondary | ICD-10-CM

## 2024-01-07 DIAGNOSIS — O09529 Supervision of elderly multigravida, unspecified trimester: Secondary | ICD-10-CM | POA: Insufficient documentation

## 2024-01-07 DIAGNOSIS — Z363 Encounter for antenatal screening for malformations: Secondary | ICD-10-CM | POA: Insufficient documentation

## 2024-01-07 DIAGNOSIS — Z3A22 22 weeks gestation of pregnancy: Secondary | ICD-10-CM

## 2024-01-07 NOTE — Progress Notes (Signed)
 After review, MFM consult with provider is not indicated for today  Cassandria Clever, MD 01/07/2024 4:02 PM  [facility]

## 2024-02-08 ENCOUNTER — Ambulatory Visit: Payer: Self-pay | Attending: Obstetrics and Gynecology | Admitting: Obstetrics and Gynecology

## 2024-02-08 ENCOUNTER — Ambulatory Visit (HOSPITAL_BASED_OUTPATIENT_CLINIC_OR_DEPARTMENT_OTHER): Payer: Self-pay

## 2024-02-08 ENCOUNTER — Other Ambulatory Visit: Payer: Self-pay | Admitting: Obstetrics and Gynecology

## 2024-02-08 ENCOUNTER — Other Ambulatory Visit: Payer: Self-pay | Admitting: *Deleted

## 2024-02-08 DIAGNOSIS — O09529 Supervision of elderly multigravida, unspecified trimester: Secondary | ICD-10-CM

## 2024-02-08 DIAGNOSIS — O365990 Maternal care for other known or suspected poor fetal growth, unspecified trimester, not applicable or unspecified: Secondary | ICD-10-CM

## 2024-02-08 DIAGNOSIS — Z3A26 26 weeks gestation of pregnancy: Secondary | ICD-10-CM

## 2024-02-08 DIAGNOSIS — O09523 Supervision of elderly multigravida, third trimester: Secondary | ICD-10-CM

## 2024-02-08 DIAGNOSIS — Z362 Encounter for other antenatal screening follow-up: Secondary | ICD-10-CM

## 2024-02-08 DIAGNOSIS — O09522 Supervision of elderly multigravida, second trimester: Secondary | ICD-10-CM

## 2024-02-08 DIAGNOSIS — O365921 Maternal care for other known or suspected poor fetal growth, second trimester, fetus 1: Secondary | ICD-10-CM | POA: Insufficient documentation

## 2024-02-08 DIAGNOSIS — O365920 Maternal care for other known or suspected poor fetal growth, second trimester, not applicable or unspecified: Secondary | ICD-10-CM

## 2024-02-08 NOTE — Progress Notes (Signed)
 Maternal-Fetal Medicine Consultation Name: Betty [name] MRN: [id number]  G3 P2002 at 26w 4d gestation.  Patient is here for fetal growth assessment and completion of fetal anatomy. On cell-free fetal DNA screening, the risks of fetal aneuploidies are not increased.  Obstetrical history significant for 2 term vaginal deliveries.  Her first child weighed 7 pounds and 5 ounces at birth and the second 6 pounds 11 ounces at birth.  Both pregnancies were uncomplicated.  Ultrasound On today's ultrasound, the estimated fetal weight is at the 2nd percentile and the abdominal circumference measurement is at the 3rd percentile.  Amniotic fluid is normal and good fetal activity seen.  Umbilical artery Doppler showed normal forward diastolic flow.  Fetal growth restriction I explained the finding of fetal growth restriction that is difficult to differentiate from a constitutionally small for gestational age fetus. I discussed the possible causes of fetal growth restriction including placental insufficiency (most common cause), chromosomal anomalies and fetal infections.  Patient did not give history of fever or rashes. I explained that only amniocentesis will give a definitive result on the fetal karyotype and some genetic conditions (Microarray).  I explained amniocentesis procedure and possible complication of preterm delivery (1 and 500 procedures). Patient opted not to have amniocentesis. I discussed ultrasound protocol of monitoring fetal growth restriction. Timing of delivery: Since small for gestational age fetuses have a higher chance of having perinatal mortality and morbidity, delivery at 53 to 105 weeks gestation is reasonable.  If, however, severe fetal growth restriction is seen with normal antenatal testing, we will recommend delivery at [redacted] weeks gestation.  I counseled the patient with the help of Spanish language interpreter present in the room.  Recommendations - An appointment was made  for her to return 2 weeks for umbilical artery Doppler study. - Fetal growth assessment in 3 weeks.   Consultation including face-to-face (more than 50%) counseling 30 minutes.

## 2024-02-25 ENCOUNTER — Other Ambulatory Visit: Payer: Self-pay | Admitting: *Deleted

## 2024-02-25 ENCOUNTER — Ambulatory Visit (HOSPITAL_BASED_OUTPATIENT_CLINIC_OR_DEPARTMENT_OTHER): Payer: Self-pay | Admitting: Obstetrics and Gynecology

## 2024-02-25 ENCOUNTER — Ambulatory Visit: Payer: Self-pay

## 2024-02-25 ENCOUNTER — Ambulatory Visit: Payer: Self-pay | Attending: Obstetrics and Gynecology

## 2024-02-25 VITALS — BP 108/60

## 2024-02-25 DIAGNOSIS — Z3A29 29 weeks gestation of pregnancy: Secondary | ICD-10-CM

## 2024-02-25 DIAGNOSIS — O09523 Supervision of elderly multigravida, third trimester: Secondary | ICD-10-CM

## 2024-02-25 DIAGNOSIS — O365930 Maternal care for other known or suspected poor fetal growth, third trimester, not applicable or unspecified: Secondary | ICD-10-CM

## 2024-02-25 DIAGNOSIS — O365990 Maternal care for other known or suspected poor fetal growth, unspecified trimester, not applicable or unspecified: Secondary | ICD-10-CM | POA: Insufficient documentation

## 2024-02-25 DIAGNOSIS — O365920 Maternal care for other known or suspected poor fetal growth, second trimester, not applicable or unspecified: Secondary | ICD-10-CM

## 2024-02-25 NOTE — Progress Notes (Signed)
 After review, MFM consult with provider is not indicated for today  Arna Ranks, MD 02/25/2024 10:27 AM  [facility]

## 2024-02-25 NOTE — Procedures (Signed)
 Betty [name] Oct 01, 1990 [redacted]w[redacted]d  Fetus A Non-Stress Test Interpretation for 02/25/24  Indication: Advanced Maternal Age >40 years  Fetal Heart Rate A Mode: External Baseline Rate (A): 145 bpm Variability: Moderate Accelerations: 10 x 10 Decelerations: None Multiple birth?: No  Uterine Activity Mode: Palpation, Toco Contraction Frequency (min): none noted Resting Tone Palpated: Relaxed  Interpretation (Fetal Testing) Nonstress Test Interpretation: Reactive Comments: Reviewed with Dr. Arna

## 2024-02-29 ENCOUNTER — Encounter: Payer: Self-pay | Admitting: *Deleted

## 2024-02-29 DIAGNOSIS — O09529 Supervision of elderly multigravida, unspecified trimester: Secondary | ICD-10-CM

## 2024-02-29 DIAGNOSIS — O365990 Maternal care for other known or suspected poor fetal growth, unspecified trimester, not applicable or unspecified: Secondary | ICD-10-CM

## 2024-02-29 DIAGNOSIS — Z603 Acculturation difficulty: Secondary | ICD-10-CM | POA: Insufficient documentation

## 2024-02-29 DIAGNOSIS — O0990 Supervision of high risk pregnancy, unspecified, unspecified trimester: Secondary | ICD-10-CM | POA: Insufficient documentation

## 2024-03-03 ENCOUNTER — Other Ambulatory Visit: Payer: Self-pay | Admitting: *Deleted

## 2024-03-03 ENCOUNTER — Ambulatory Visit (HOSPITAL_BASED_OUTPATIENT_CLINIC_OR_DEPARTMENT_OTHER): Payer: Self-pay | Admitting: Maternal & Fetal Medicine

## 2024-03-03 ENCOUNTER — Ambulatory Visit (HOSPITAL_BASED_OUTPATIENT_CLINIC_OR_DEPARTMENT_OTHER): Payer: Self-pay

## 2024-03-03 ENCOUNTER — Ambulatory Visit: Payer: Self-pay | Attending: Obstetrics and Gynecology

## 2024-03-03 VITALS — BP 105/56

## 2024-03-03 DIAGNOSIS — Z603 Acculturation difficulty: Secondary | ICD-10-CM | POA: Insufficient documentation

## 2024-03-03 DIAGNOSIS — Z758 Other problems related to medical facilities and other health care: Secondary | ICD-10-CM

## 2024-03-03 DIAGNOSIS — O09523 Supervision of elderly multigravida, third trimester: Secondary | ICD-10-CM | POA: Insufficient documentation

## 2024-03-03 DIAGNOSIS — Z3A30 30 weeks gestation of pregnancy: Secondary | ICD-10-CM

## 2024-03-03 DIAGNOSIS — O365930 Maternal care for other known or suspected poor fetal growth, third trimester, not applicable or unspecified: Secondary | ICD-10-CM

## 2024-03-03 DIAGNOSIS — O365990 Maternal care for other known or suspected poor fetal growth, unspecified trimester, not applicable or unspecified: Secondary | ICD-10-CM

## 2024-03-03 DIAGNOSIS — O0990 Supervision of high risk pregnancy, unspecified, unspecified trimester: Secondary | ICD-10-CM | POA: Insufficient documentation

## 2024-03-03 NOTE — Procedures (Signed)
 Betty [name] 1990-11-28 [redacted]w[redacted]d  Fetus A Non-Stress Test Interpretation for 03/03/24  Indication: IUGR  Fetal Heart Rate A Mode: External Baseline Rate (A): 130 bpm Variability: Moderate Accelerations: 15 x 15 Decelerations: None Multiple birth?: No  Uterine Activity Mode: Toco, Palpation Contraction Frequency (min): none noted Resting Tone Palpated: Relaxed  Interpretation (Fetal Testing) Nonstress Test Interpretation: Reactive Comments: Reviewed with Dr. William

## 2024-03-03 NOTE — Progress Notes (Signed)
   Patient information  Patient Name: Betty [name]  Patient MRN:   [id number]  Referring practice: MFM Referring Provider: [facility])  Problem List   Patient Active Problem List   Diagnosis Date Noted   Fetal growth restriction antepartum 02/29/2024   Supervision of high risk pregnancy, antepartum 02/29/2024   Language barrier 02/29/2024   AMA (advanced maternal age) multigravida 35+ 12/30/2023    Maternal Fetal medicine Consult  [facility] [name] is a 33 y.o. G3P2002 at [redacted]w[redacted]d here for ultrasound and consultation. Betty [name] is doing well today with no acute concerns. Today we focused on the following:   FGR: The patient is here due to fetal growth restriction previously at the 2.3% overall with AC at the 3rd percentile.  Umbilical artery Dopplers were normal.  Today the fetal growth is slightly improved at the 5th percentile overall in the 12th percentile at the [facility].  The umbilical artery Dopplers are at the upper limit of normal with a few elevated over the 95th percentile.  NST was reactive.  She reports good fetal movement.  The patient had time to ask questions that were answered to her satisfaction.  She verbalized understanding and agrees to proceed with the plan below.  Sonographic findings Single intrauterine pregnancy at 30w 0d. Fetal cardiac activity:  Observed and appears normal. Presentation: Cephalic. Interval fetal anatomy appears normal. Fetal biometry shows the estimated fetal weight at the 5 percentile and the abdominal circumference at the 12 percentile.  Amniotic fluid: Within normal limits.  MVP: 4.34 cm. Placenta: Posterior. Umbilical artery dopplers findings: -S/D:3.8 which are in the 90th percentile at this gestational age.  -Absent end-diastolic flow: No.  -Reversed end-diastolic flow:  No.  There are limitations of prenatal ultrasound such as the inability to detect certain abnormalities due to poor visualization. Various  factors such as fetal position, gestational age and maternal body habitus may increase the difficulty in visualizing the fetal anatomy.    Recommendations - Continue weekly UA dopplers and antenatal testing until delivery. - Serial growth US  every 3 weeks until delivery.  Review of Systems: A review of systems was performed and was negative except per HPI   Vitals and Physical Exam    03/03/2024    7:40 AM 02/25/2024    7:21 AM 02/08/2024    2:00 PM  Vitals with BMI  Systolic 105 108 890  Diastolic 56 60 59  Pulse   76    Sitting comfortably on the sonogram table Nonlabored breathing Normal rate and rhythm Abdomen is nontender  Past pregnancies OB History  Gravida Para Term Preterm AB Living  3 2 2   2   SAB IAB Ectopic Multiple Live Births     0 2    # Outcome Date GA Lbr Len/2nd Weight Sex Type Anes PTL Lv  3 Current           2 Term 03/01/16 [redacted]w[redacted]d 08:11 / 00:14 6 lb 10.9 oz (3.03 kg) M Vag-Spont None  LIV  1 Term 05/07/12 [redacted]w[redacted]d 05:51 / 00:42 7 lb 5.1 oz (3.32 kg) F Vag-Spont EPI  LIV     I spent 20 minutes reviewing the patients chart, including labs and images as well as counseling the patient about her medical conditions. Greater than 50% of the time was spent in direct face-to-face patient counseling.  Delora Smaller  MFM, [facility]   03/03/2024  8:09 AM

## 2024-03-08 ENCOUNTER — Ambulatory Visit (INDEPENDENT_AMBULATORY_CARE_PROVIDER_SITE_OTHER): Payer: Self-pay | Admitting: Obstetrics and Gynecology

## 2024-03-08 ENCOUNTER — Other Ambulatory Visit: Payer: Self-pay

## 2024-03-08 ENCOUNTER — Encounter: Payer: Self-pay | Admitting: Obstetrics and Gynecology

## 2024-03-08 VITALS — BP 100/64 | HR 110 | Wt 176.0 lb

## 2024-03-08 DIAGNOSIS — O0993 Supervision of high risk pregnancy, unspecified, third trimester: Secondary | ICD-10-CM

## 2024-03-08 DIAGNOSIS — O0990 Supervision of high risk pregnancy, unspecified, unspecified trimester: Secondary | ICD-10-CM

## 2024-03-08 DIAGNOSIS — O09523 Supervision of elderly multigravida, third trimester: Secondary | ICD-10-CM

## 2024-03-08 DIAGNOSIS — Z603 Acculturation difficulty: Secondary | ICD-10-CM

## 2024-03-08 DIAGNOSIS — O365930 Maternal care for other known or suspected poor fetal growth, third trimester, not applicable or unspecified: Secondary | ICD-10-CM

## 2024-03-08 DIAGNOSIS — Z758 Other problems related to medical facilities and other health care: Secondary | ICD-10-CM

## 2024-03-08 DIAGNOSIS — Z1332 Encounter for screening for maternal depression: Secondary | ICD-10-CM

## 2024-03-08 DIAGNOSIS — O365990 Maternal care for other known or suspected poor fetal growth, unspecified trimester, not applicable or unspecified: Secondary | ICD-10-CM

## 2024-03-08 DIAGNOSIS — Z3A30 30 weeks gestation of pregnancy: Secondary | ICD-10-CM

## 2024-03-08 NOTE — Progress Notes (Signed)
 INITIAL PRENATAL VISIT NOTE  Subjective:  Betty [name] is a 33 y.o. G3P2002 at [redacted]w[redacted]d by 1st trim US  being seen today for her initial prenatal visit. She has an obstetric history significant for 2 x term SVD. She has a medical history significant for vitiligo.  Patient reports no complaints.  Contractions: Irritability. Vag. Bleeding: None.  Movement: Present. Denies leaking of fluid.   Passed 3 hr GTT at [facility].  Past Medical History:  Diagnosis Date   UTI (urinary tract infection)    Vitiligo 04/25/2021   Wellness examination 04/25/2021    No past surgical history on file.  OB History  Gravida Para Term Preterm AB Living  3 2 2  0 0 2  SAB IAB Ectopic Multiple Live Births  0 0 0 0 2    # Outcome Date GA Lbr Len/2nd Weight Sex Type Anes PTL Lv  3 Current           2 Term 03/01/16 [redacted]w[redacted]d 08:11 / 00:14 6 lb 10.9 oz (3.03 kg) M Vag-Spont None  LIV  1 Term 05/07/12 [redacted]w[redacted]d 05:51 / 00:42 7 lb 5.1 oz (3.32 kg) F Vag-Spont EPI  LIV    Social History   Socioeconomic History   Marital status: Significant Other    Spouse name: Not on file   Number of children: Not on file   Years of education: Not on file   Highest education level: Not on file  Occupational History   Not on file  Tobacco Use   Smoking status: Never   Smokeless tobacco: Never  Vaping Use   Vaping status: Never Used  Substance and Sexual Activity   Alcohol use: No   Drug use: No   Sexual activity: Yes    Birth control/protection: I.U.D.  Other Topics Concern   Not on file  Social History Narrative   Not on file   Social Drivers of Health   Financial Resource Strain: Not on file  Food Insecurity: Not on file  Transportation Needs: Not on file  Physical Activity: Not on file  Stress: Not on file  Social Connections: Not on file    Family History  Problem Relation Age of Onset   Cancer Neg Hx    Hypertension Neg Hx      Current Outpatient Medications:    acetaminophen  (TYLENOL ) 500 MG  tablet, Take 1 tablet every 6 hours by oral route as needed for 3 days, for pain., Disp: , Rfl:    aspirin EC 81 MG tablet, Take 81 mg by mouth daily. Swallow whole., Disp: , Rfl:    Prenatal Vit-Fe Fumarate-FA (PRENATAL MULTIVITAMIN) TABS tablet, Take 1 tablet by mouth daily at 12 noon., Disp: , Rfl:   No Known Allergies  Review of Systems: Negative except for what is mentioned in HPI.  Objective:   Vitals:   03/08/24 1058  BP: 100/64  Pulse: (!) 110  Weight: 176 lb (79.8 kg)    Fetal Status: Fetal Heart Rate (bpm): 147 Fundal Height: 29 cm Movement: Present     Physical Exam: BP 100/64   Pulse (!) 110   Wt 176 lb (79.8 kg)   LMP 07/29/2023   BMI 30.69 kg/m  CONSTITUTIONAL: Well-developed, well-nourished female in no acute distress.  NEUROLOGIC: Alert and oriented to person, place, and time. Normal reflexes, muscle tone coordination. No cranial nerve deficit noted. PSYCHIATRIC: Normal mood and affect. Normal behavior. Normal judgment and thought content. SKIN: Skin is warm and dry. No rash noted. Not diaphoretic.  No erythema. No pallor. HENT:  Normocephalic, atraumatic, External right and left ear normal. Oropharynx is clear and moist EYES: Conjunctivae and EOM are normal. Pupils are equal, round, and reactive to light. No scleral icterus.  NECK: Normal range of motion, supple, no masses CARDIOVASCULAR: Normal heart rate noted, regular rhythm RESPIRATORY: Effort normal, no problems with respiration noted BREASTS: symmetric, non-tender, no masses palpable ABDOMEN: Soft, nontender, nondistended, gravid. GU: deferred MUSCULOSKELETAL: Normal range of motion. EXT:  No edema and no tenderness.    Assessment and Plan:  Pregnancy: G3P2002 at [redacted]w[redacted]d by 1st trim US   1. Supervision of high risk pregnancy, antepartum (Primary) Reviewed [facility] [employer] structure, multiple providers, fellows, medical students, virtual visits, MyChart.  Provided anticipatory  guidance for pregnancy, visits, delivery, etc. Gave info for BTL and cost estimate Reviewed labs from [facility]  2. Fetal growth restriction antepartum Being followed by MFM Has been 2.3% with last growth 5%tile and AC 12th%tile Based on this, will plan for delivery at about 38 weeks, reviewed with patient but that this could also change depending on growth of baby  3. Multigravida of advanced maternal age in third trimester Cont baby aspirin   4. Language barrier Engineer, structural used.    Preterm labor symptoms and general obstetric precautions including but not limited to vaginal bleeding, contractions, leaking of fluid and fetal movement were reviewed in detail with the patient.  Please refer to After Visit Summary for other counseling recommendations.   Return in about 2 weeks (around 03/22/2024) for high OB.  Burnard CHRISTELLA Moats 03/08/2024 11:27 AM

## 2024-03-10 ENCOUNTER — Ambulatory Visit (HOSPITAL_BASED_OUTPATIENT_CLINIC_OR_DEPARTMENT_OTHER): Payer: Self-pay | Admitting: Obstetrics and Gynecology

## 2024-03-10 ENCOUNTER — Ambulatory Visit (HOSPITAL_BASED_OUTPATIENT_CLINIC_OR_DEPARTMENT_OTHER): Payer: Self-pay

## 2024-03-10 ENCOUNTER — Ambulatory Visit: Payer: Self-pay | Attending: Obstetrics and Gynecology

## 2024-03-10 VITALS — BP 113/57 | HR 75

## 2024-03-10 DIAGNOSIS — O365990 Maternal care for other known or suspected poor fetal growth, unspecified trimester, not applicable or unspecified: Secondary | ICD-10-CM | POA: Insufficient documentation

## 2024-03-10 DIAGNOSIS — Z3A31 31 weeks gestation of pregnancy: Secondary | ICD-10-CM | POA: Insufficient documentation

## 2024-03-10 DIAGNOSIS — O365930 Maternal care for other known or suspected poor fetal growth, third trimester, not applicable or unspecified: Secondary | ICD-10-CM

## 2024-03-10 DIAGNOSIS — O09523 Supervision of elderly multigravida, third trimester: Secondary | ICD-10-CM | POA: Insufficient documentation

## 2024-03-10 NOTE — Progress Notes (Signed)
 After review, MFM consult with provider is not indicated for today  Arna Ranks, MD 03/10/2024 3:25 PM  [facility]

## 2024-03-10 NOTE — Procedures (Signed)
 Betty [name] 05/07/1991 [redacted]w[redacted]d  Fetus A Non-Stress Test Interpretation for 03/10/24  Indication: IUGR  Fetal Heart Rate A Mode: External Baseline Rate (A): 140 bpm Variability: Moderate Accelerations: 15 x 15 Decelerations: None Multiple birth?: No  Uterine Activity Mode: Palpation, Toco Contraction Frequency (min): none noted Resting Tone Palpated: Relaxed  Interpretation (Fetal Testing) Nonstress Test Interpretation: Reactive Comments: Reviewed with Dr. Arna

## 2024-03-17 ENCOUNTER — Ambulatory Visit (HOSPITAL_BASED_OUTPATIENT_CLINIC_OR_DEPARTMENT_OTHER): Payer: Self-pay

## 2024-03-17 ENCOUNTER — Ambulatory Visit: Payer: Self-pay | Attending: Maternal & Fetal Medicine | Admitting: Obstetrics

## 2024-03-17 VITALS — BP 120/58 | HR 87

## 2024-03-17 DIAGNOSIS — O0990 Supervision of high risk pregnancy, unspecified, unspecified trimester: Secondary | ICD-10-CM

## 2024-03-17 DIAGNOSIS — O09523 Supervision of elderly multigravida, third trimester: Secondary | ICD-10-CM

## 2024-03-17 DIAGNOSIS — Z3A32 32 weeks gestation of pregnancy: Secondary | ICD-10-CM | POA: Insufficient documentation

## 2024-03-17 DIAGNOSIS — O365930 Maternal care for other known or suspected poor fetal growth, third trimester, not applicable or unspecified: Secondary | ICD-10-CM

## 2024-03-17 DIAGNOSIS — Z362 Encounter for other antenatal screening follow-up: Secondary | ICD-10-CM | POA: Insufficient documentation

## 2024-03-17 DIAGNOSIS — Z758 Other problems related to medical facilities and other health care: Secondary | ICD-10-CM

## 2024-03-17 DIAGNOSIS — O365990 Maternal care for other known or suspected poor fetal growth, unspecified trimester, not applicable or unspecified: Secondary | ICD-10-CM

## 2024-03-17 NOTE — Progress Notes (Signed)
 MFM Note  Betty [name] is currently at 32 weeks and 0 days.  She has been followed due to IUGR and advanced maternal age.  She denies any problems since her last exam and reports feeling fetal movements throughout the day.  A BPP performed today was 8/8.  There was normal amniotic fluid noted with a total AFI of 13.42 cm.  Doppler studies of the umbilical arteries performed due to fetal growth restriction showed a normal S/D ratio of 3.35.  There were no signs of absent or reversed end-diastolic flow noted today.  She will return in 1 week for another BPP, growth scan, and umbilical artery Doppler study.

## 2024-03-24 ENCOUNTER — Encounter: Payer: Self-pay | Admitting: Family Medicine

## 2024-03-24 ENCOUNTER — Ambulatory Visit (HOSPITAL_BASED_OUTPATIENT_CLINIC_OR_DEPARTMENT_OTHER): Payer: Self-pay

## 2024-03-24 ENCOUNTER — Other Ambulatory Visit: Payer: Self-pay

## 2024-03-24 ENCOUNTER — Ambulatory Visit: Payer: Self-pay | Attending: Maternal & Fetal Medicine | Admitting: Obstetrics and Gynecology

## 2024-03-24 VITALS — BP 111/62 | HR 83

## 2024-03-24 DIAGNOSIS — O09523 Supervision of elderly multigravida, third trimester: Secondary | ICD-10-CM

## 2024-03-24 DIAGNOSIS — O0990 Supervision of high risk pregnancy, unspecified, unspecified trimester: Secondary | ICD-10-CM

## 2024-03-24 DIAGNOSIS — O365990 Maternal care for other known or suspected poor fetal growth, unspecified trimester, not applicable or unspecified: Secondary | ICD-10-CM

## 2024-03-24 DIAGNOSIS — Z3A33 33 weeks gestation of pregnancy: Secondary | ICD-10-CM | POA: Insufficient documentation

## 2024-03-24 DIAGNOSIS — O365930 Maternal care for other known or suspected poor fetal growth, third trimester, not applicable or unspecified: Secondary | ICD-10-CM | POA: Insufficient documentation

## 2024-03-24 NOTE — Progress Notes (Signed)
 After review, MFM consult with provider is not indicated for today  Arna Ranks, MD 03/24/2024 5:13 PM  [facility]

## 2024-04-01 ENCOUNTER — Encounter: Payer: Self-pay | Admitting: Obstetrics & Gynecology

## 2024-04-01 ENCOUNTER — Ambulatory Visit (INDEPENDENT_AMBULATORY_CARE_PROVIDER_SITE_OTHER): Payer: Self-pay | Admitting: Obstetrics & Gynecology

## 2024-04-01 ENCOUNTER — Other Ambulatory Visit: Payer: Self-pay

## 2024-04-01 VITALS — BP 114/62 | HR 78 | Wt 175.0 lb

## 2024-04-01 DIAGNOSIS — O365930 Maternal care for other known or suspected poor fetal growth, third trimester, not applicable or unspecified: Secondary | ICD-10-CM

## 2024-04-01 DIAGNOSIS — O0990 Supervision of high risk pregnancy, unspecified, unspecified trimester: Secondary | ICD-10-CM

## 2024-04-01 DIAGNOSIS — O0993 Supervision of high risk pregnancy, unspecified, third trimester: Secondary | ICD-10-CM

## 2024-04-01 DIAGNOSIS — Z3A34 34 weeks gestation of pregnancy: Secondary | ICD-10-CM

## 2024-04-01 DIAGNOSIS — O365990 Maternal care for other known or suspected poor fetal growth, unspecified trimester, not applicable or unspecified: Secondary | ICD-10-CM

## 2024-04-01 DIAGNOSIS — O09523 Supervision of elderly multigravida, third trimester: Secondary | ICD-10-CM

## 2024-04-01 MED ORDER — ASPIRIN 81 MG PO TBEC: 81.0000 mg | DELAYED_RELEASE_TABLET | Freq: Every day | ORAL | 6 refills | Status: DC

## 2024-04-01 MED ORDER — PRENATAL 28-0.8 MG PO TABS
1.0000 | ORAL_TABLET | Freq: Every day | ORAL | 12 refills | Status: DC
Start: 2024-04-01 — End: 2024-05-05

## 2024-04-01 NOTE — Progress Notes (Signed)
 Patient is Spanish-speaking only, interpreter present for this encounter.  PRENATAL VISIT NOTE  Subjective:  Betty [name] is a 33 y.o. G3P2002 at [redacted]w[redacted]d being seen today for ongoing prenatal care.  She is currently monitored for the following issues for this high-risk pregnancy and has AMA (advanced maternal age) multigravida 35+; Fetal growth restriction antepartum; Supervision of high risk pregnancy, antepartum; and Language barrier on their problem list.  Patient reports no complaints.  Contractions: Not present. Vag. Bleeding: None.  Movement: Present. Denies leaking of fluid.   The following portions of the patient's history were reviewed and updated as appropriate: allergies, current medications, past family history, past medical history, past social history, past surgical history and problem list.   Objective:    Vitals:   04/01/24 1007  BP: 114/62  Pulse: 78  Weight: 175 lb (79.4 kg)    Fetal Status:  Fetal Heart Rate (bpm): 141   Movement: Present    General: Alert, oriented and cooperative. Patient is in no acute distress.  Skin: Skin is warm and dry. No rash noted.   Cardiovascular: Normal heart rate noted  Respiratory: Normal respiratory effort, no problems with respiration noted  Abdomen: Soft, gravid, appropriate for gestational age.  Pain/Pressure: Absent     Pelvic: Cervical exam deferred        Extremities: Normal range of motion.  Edema: None  Mental Status: Normal mood and affect. Normal behavior. Normal judgment and thought content.   US  MFM OB FOLLOW UP Result Date: 03/24/2024 ----------------------------------------------------------------------  OBSTETRICS REPORT                       (Signed Final 03/24/2024 05:13 pm) ---------------------------------------------------------------------- Patient Info  ID #:       [id number]                          D.O.B.:  1991-01-10 (33 yrs)(F)  Name:       Betty [name]                 Visit Date: 03/24/2024 11:45 am  ---------------------------------------------------------------------- Performed By  Attending:        Fredia Fresh MD        Ref. Address:     [address]                                                             [place], [place]                                                             [address]  Performed By:     Emelia Coombs BS,       [facility]                    RDMS, RVT  [facility]  Referred By:      GLORIS DELENA HUGGER MD ---------------------------------------------------------------------- Orders  #  Description                           Code        Ordered By  1  US  MFM OB FOLLOW UP                   76816.01    BURK SCHAIBLE  2  US  MFM FETAL BPP WO NON               76819.01    BURK SCHAIBLE     STRESS  3  US  MFM UA CORD DOPPLER                76820.02    [facility] ----------------------------------------------------------------------  #  Order #                     Accession #                Episode #  1  [id number]                   [id number]                 [id number]  2  [id number]                   [id number]                 [id number]  3  [id number]                   [id number]                 [id number] ---------------------------------------------------------------------- Indications  [redacted] weeks gestation of pregnancy                Z3A.33  Maternal care for known or suspected poor      O36.5930  fetal growth, third trimester, single or  unspecified fetus IUGR  Advanced maternal age multigravida 27+,        O41.523  third trimester  Encounter for other antenatal screening        Z36.2  follow-up  Low risk NIPS Neg AFP NEG Horizon, 3hr  GTT WNL ---------------------------------------------------------------------- Fetal Evaluation  Num Of Fetuses:         1  Fetal Heart Rate(bpm):  142   Cardiac Activity:       Observed  Presentation:           Cephalic  Placenta:               Posterior  P. Cord Insertion:      Previously seen  Amniotic Fluid  AFI FV:      Within normal limits  AFI Sum(cm)     %Tile       Largest Pocket(cm)  14.27           50          6.79  RUQ(cm)       RLQ(cm)       LUQ(cm)        LLQ(cm)  4.61          2.87          0              6.79 ---------------------------------------------------------------------- Biophysical Evaluation  Amniotic F.V:   Within normal limits       F. Tone:        Observed  F. Movement:    Observed                   Score:          8/8  F. Breathing:   Observed ---------------------------------------------------------------------- Biometry  BPD:      84.2  mm     G. Age:  33w 6d         71  %    CI:         77.3   %    70 - 86                                                          FL/HC:      19.0   %    19.9 - 21.5  HC:      303.2  mm     G. Age:  33w 5d         30  %    HC/AC:      1.08        0.96 - 1.11  AC:      280.4  mm     G. Age:  32w 1d         25  %    FL/BPD:     68.3   %    71 - 87  FL:       57.5  mm     G. Age:  30w 1d        < 1  %    FL/AC:      20.5   %    20 - 24  LV:          5  mm  Est. FW:    1840  gm      4 lb 1 oz     12  % ---------------------------------------------------------------------- OB History  Gravidity:    3         Term:   2 ---------------------------------------------------------------------- Gestational Age  U/S Today:     32w 3d                                        EDD:   05/16/24  Best:          33w 0d     Det. By:  Early Ultrasound         EDD:   05/12/24                                      (09/25/23) ---------------------------------------------------------------------- Anatomy  Cranium:               Appears normal         Aortic Arch:            Previously seen  Cavum:                 Appears normal         Ductal Arch:            Previously seen  Ventricles:            Appears normal         Diaphragm:               Appears normal  Choroid Plexus:        Previously seen        Stomach:                Appears normal, left                                                                        sided  Cerebellum:            Previously seen        Abdomen:                Previously seen  Posterior Fossa:       Previously seen        Abdominal Wall:         Previously seen  Face:                  Orbits and profile     Cord Vessels:           Previously seen                         previously seen  Lips:                  Previously seen        Kidneys:                Appear normal  Thoracic:              Appears normal         Bladder:                Appears normal  Heart:                 Appears normal         Spine:                  Previously seen                         (  4CH, axis, and                         situs)  RVOT:                  Previously seen        Upper Extremities:      Previously seen  LVOT:                  Previously seen        Lower Extremities:      Previously seen  Other:  Female gender previously seen. Fetal anatomic survey complete on          prior scans. ---------------------------------------------------------------------- Doppler - Fetal Vessels  Umbilical Artery   S/D     %tile      RI    %tile      PI    %tile     PSV                                                     (cm/s)   3.49       90    0.71       90    1.21       94    48.03 ---------------------------------------------------------------------- Cervix Uterus Adnexa  Cervix  Not visualized (advanced GA >24wks)  Uterus  No abnormality visualized.  Right Ovary  Within normal limits.  Left Ovary  Within normal limits.  Cul De Sac  No free fluid seen.  Adnexa  No abnormality visualized ---------------------------------------------------------------------- Impression  Fetal growth restriction.  On previous fetal growth  assessment, the estimated fetal weight was at the 5th  percentile.  Patient does not have gestational diabetes.   Blood pressure today at our office is 111/62 mmHg.  On today's ultrasound, amniotic fluid is normal with good fetal  activity seen.  Fetal growth is appropriate for gestational age.  The estimated fetal weight is at the 12 percentile and the  femur length measurement is at the 1st percentile.  Amniotic  fluid is normal and good fetal activity seen.  Umbilical artery  Doppler showed normal for diastolic flow.  Antenatal testing is  reassuring.  BPP 8/8.  I reassured the patient of good interval fetal growth and  resolution of fetal growth restriction.  Ultrasound, however,  has limitations in accurately estimating fetal weights.  I counseled the patient with the help of Spanish language  interpreter present in the room. ---------------------------------------------------------------------- Recommendations  -An appointment was made for her to return in 3 weeks for  fetal growth assessment. ----------------------------------------------------------------------                 Fredia Fresh, MD Electronically Signed Final Report   03/24/2024 05:13 pm ----------------------------------------------------------------------   US  MFM FETAL BPP WO NON STRESS Result Date: 03/24/2024 ----------------------------------------------------------------------  OBSTETRICS REPORT                       (Signed Final 03/24/2024 05:13 pm) ---------------------------------------------------------------------- Patient Info  ID #:       [id number]                          D.O.B.:  11/11/1990 (33 yrs)(F)  Name:  [name]                 Visit Date: 03/24/2024 11:45 am ---------------------------------------------------------------------- Performed By  Attending:        Fredia Fresh MD        Ref. Address:     [address]                                                             [place], [place]                                                             [address]  Performed By:     Emelia Coombs BS,       [facility]                    [facility], RVT                                [facility]  Referred By:      GLORIS DELENA HUGGER MD ---------------------------------------------------------------------- Orders  #  Description                           Code        Ordered By  1  US  MFM OB FOLLOW UP                   23183.98    BURK SCHAIBLE  2  US  MFM FETAL BPP WO NON               76819.01    BURK SCHAIBLE     STRESS  3  US  MFM UA CORD DOPPLER                23179.97    BURK SCHAIBLE ----------------------------------------------------------------------  #  Order #                     Accession #                Episode #  1  [id number]                   [id number]                 [id number]  2  [id number]                   [id number]                 [id number]  3  [id number]                   [id number]                 [id number] ---------------------------------------------------------------------- Indications  [redacted] weeks gestation of pregnancy                Z3A.33  Maternal care for known or suspected poor      O36.5930  fetal growth, third trimester, single or  unspecified fetus IUGR  Advanced maternal age multigravida 27+,        O54.523  third trimester  Encounter for other antenatal screening        Z36.2  follow-up  Low risk NIPS Neg AFP NEG Horizon, 3hr  GTT WNL ---------------------------------------------------------------------- Fetal Evaluation  Num Of Fetuses:         1  Fetal Heart Rate(bpm):  142  Cardiac Activity:       Observed  Presentation:           Cephalic  Placenta:               Posterior  P. Cord Insertion:      Previously seen  Amniotic Fluid  AFI FV:      Within normal limits  AFI Sum(cm)     %Tile       Largest Pocket(cm)  14.27           50          6.79  RUQ(cm)       RLQ(cm)       LUQ(cm)        LLQ(cm)  4.61          2.87          0               6.79 ---------------------------------------------------------------------- Biophysical Evaluation  Amniotic F.V:   Within normal limits       F. Tone:        Observed  F. Movement:    Observed                   Score:          8/8  F. Breathing:   Observed ---------------------------------------------------------------------- Biometry  BPD:      84.2  mm     G. Age:  33w 6d         71  %    CI:         77.3   %    70 - 86                                                          FL/HC:      19.0   %    19.9 - 21.5  HC:  303.2  mm     G. Age:  33w 5d         30  %    HC/AC:      1.08        0.96 - 1.11  AC:      280.4  mm     G. Age:  32w 1d         25  %    FL/BPD:     68.3   %    71 - 87  FL:       57.5  mm     G. Age:  30w 1d        < 1  %    FL/AC:      20.5   %    20 - 24  LV:          5  mm  Est. FW:    1840  gm      4 lb 1 oz     12  % ---------------------------------------------------------------------- OB History  Gravidity:    3         Term:   2 ---------------------------------------------------------------------- Gestational Age  U/S Today:     32w 3d                                        EDD:   05/16/24  Best:          33w 0d     Det. By:  Early Ultrasound         EDD:   05/12/24                                      (09/25/23) ---------------------------------------------------------------------- Anatomy  Cranium:               Appears normal         Aortic Arch:            Previously seen  Cavum:                 Appears normal         Ductal Arch:            Previously seen  Ventricles:            Appears normal         Diaphragm:              Appears normal  Choroid Plexus:        Previously seen        Stomach:                Appears normal, left                                                                        sided  Cerebellum:            Previously seen        Abdomen:  Previously seen  Posterior Fossa:       Previously seen        Abdominal Wall:         Previously seen   Face:                  Orbits and profile     Cord Vessels:           Previously seen                         previously seen  Lips:                  Previously seen        Kidneys:                Appear normal  Thoracic:              Appears normal         Bladder:                Appears normal  Heart:                 Appears normal         Spine:                  Previously seen                         (4CH, axis, and                         situs)  RVOT:                  Previously seen        Upper Extremities:      Previously seen  LVOT:                  Previously seen        Lower Extremities:      Previously seen  Other:  Female gender previously seen. Fetal anatomic survey complete on          prior scans. ---------------------------------------------------------------------- Doppler - Fetal Vessels  Umbilical Artery   S/D     %tile      RI    %tile      PI    %tile     PSV                                                     (cm/s)   3.49       90    0.71       90    1.21       94    48.03 ---------------------------------------------------------------------- Cervix Uterus Adnexa  Cervix  Not visualized (advanced GA >24wks)  Uterus  No abnormality visualized.  Right Ovary  Within normal limits.  Left Ovary  Within normal limits.  Cul De Sac  No free fluid seen.  Adnexa  No abnormality visualized ---------------------------------------------------------------------- Impression  Fetal growth restriction.  On previous fetal growth  assessment, the estimated fetal weight was at the 5th  percentile.  Patient does not have gestational diabetes.  Blood pressure today at our office is  111/62 mmHg.  On today's ultrasound, amniotic fluid is normal with good fetal  activity seen.  Fetal growth is appropriate for gestational age.  The estimated fetal weight is at the 12 percentile and the  femur length measurement is at the 1st percentile.  Amniotic  fluid is normal and good fetal activity seen.  Umbilical artery   Doppler showed normal for diastolic flow.  Antenatal testing is  reassuring.  BPP 8/8.  I reassured the patient of good interval fetal growth and  resolution of fetal growth restriction.  Ultrasound, however,  has limitations in accurately estimating fetal weights.  I counseled the patient with the help of Spanish language  interpreter present in the room. ---------------------------------------------------------------------- Recommendations  -An appointment was made for her to return in 3 weeks for  fetal growth assessment. ----------------------------------------------------------------------                 Fredia Fresh, MD Electronically Signed Final Report   03/24/2024 05:13 pm ----------------------------------------------------------------------   US  MFM UA CORD DOPPLER Result Date: 03/24/2024 ----------------------------------------------------------------------  OBSTETRICS REPORT                       (Signed Final 03/24/2024 05:13 pm) ---------------------------------------------------------------------- Patient Info  ID #:       [id number]                          D.O.B.:  February 22, 1991 (33 yrs)(F)  Name:       [facility] [name]                 Visit Date: 03/24/2024 11:45 am ---------------------------------------------------------------------- Performed By  Attending:        Fredia Fresh MD        Ref. Address:     [address]                                                             [place], [place]                                                             [address]  Performed By:     Emelia Coombs BS,       [facility]                    RDMS, RVT                                [facility]  Referred By:      GLORIS DELENA HUGGER MD ---------------------------------------------------------------------- Orders  #  Description                            Code        Ordered By  1  US  MFM OB FOLLOW UP                   23183.98    BURK SCHAIBLE  2  US  MFM FETAL BPP WO NON               76819.01    BURK SCHAIBLE     STRESS  3  US  MFM UA CORD DOPPLER                76820.02    [facility] ----------------------------------------------------------------------  #  Order #                     Accession #                Episode #  1  [id number]                   [id number]                 [id number]  2  [id number]                   [id number]                 [id number]  3  [id number]                   [id number]                 [id number] ---------------------------------------------------------------------- Indications  [redacted] weeks gestation of pregnancy                Z3A.33  Maternal care for known or suspected poor      O36.5930  fetal growth, third trimester, single or  unspecified fetus IUGR  Advanced maternal age multigravida 57+,        O33.523  third trimester  Encounter for other antenatal screening        Z36.2  follow-up  Low risk NIPS Neg AFP NEG Horizon, 3hr  GTT WNL ---------------------------------------------------------------------- Fetal Evaluation  Num Of Fetuses:         1  Fetal Heart Rate(bpm):  142  Cardiac Activity:       Observed  Presentation:           Cephalic  Placenta:               Posterior  P. Cord Insertion:      Previously seen  Amniotic Fluid  AFI FV:      Within normal limits  AFI Sum(cm)     %Tile       Largest Pocket(cm)  14.27           50          6.79  RUQ(cm)       RLQ(cm)       LUQ(cm)        LLQ(cm)  4.61          2.87  0              6.79 ---------------------------------------------------------------------- Biophysical Evaluation  Amniotic F.V:   Within normal limits       F. Tone:        Observed  F. Movement:    Observed                   Score:          8/8  F. Breathing:   Observed ---------------------------------------------------------------------- Biometry  BPD:      84.2  mm     G. Age:  33w 6d          71  %    CI:         77.3   %    70 - 86                                                          FL/HC:      19.0   %    19.9 - 21.5  HC:      303.2  mm     G. Age:  33w 5d         30  %    HC/AC:      1.08        0.96 - 1.11  AC:      280.4  mm     G. Age:  32w 1d         25  %    FL/BPD:     68.3   %    71 - 87  FL:       57.5  mm     G. Age:  30w 1d        < 1  %    FL/AC:      20.5   %    20 - 24  LV:          5  mm  Est. FW:    1840  gm      4 lb 1 oz     12  % ---------------------------------------------------------------------- OB History  Gravidity:    3         Term:   2 ---------------------------------------------------------------------- Gestational Age  U/S Today:     32w 3d                                        EDD:   05/16/24  Best:          33w 0d     Det. By:  Early Ultrasound         EDD:   05/12/24                                      (09/25/23) ---------------------------------------------------------------------- Anatomy  Cranium:               Appears normal         Aortic Arch:            Previously seen  Cavum:  Appears normal         Ductal Arch:            Previously seen  Ventricles:            Appears normal         Diaphragm:              Appears normal  Choroid Plexus:        Previously seen        Stomach:                Appears normal, left                                                                        sided  Cerebellum:            Previously seen        Abdomen:                Previously seen  Posterior Fossa:       Previously seen        Abdominal Wall:         Previously seen  Face:                  Orbits and profile     Cord Vessels:           Previously seen                         previously seen  Lips:                  Previously seen        Kidneys:                Appear normal  Thoracic:              Appears normal         Bladder:                Appears normal  Heart:                 Appears normal         Spine:                  Previously  seen                         (4CH, axis, and                         situs)  RVOT:                  Previously seen        Upper Extremities:      Previously seen  LVOT:                  Previously seen        Lower Extremities:      Previously seen  Other:  Female gender previously seen. Fetal anatomic survey complete on          prior scans. ---------------------------------------------------------------------- Doppler -  Fetal Vessels  Umbilical Artery   S/D     %tile      RI    %tile      PI    %tile     PSV                                                     (cm/s)   3.49       90    0.71       90    1.21       94    48.03 ---------------------------------------------------------------------- Cervix Uterus Adnexa  Cervix  Not visualized (advanced GA >24wks)  Uterus  No abnormality visualized.  Right Ovary  Within normal limits.  Left Ovary  Within normal limits.  Cul De Sac  No free fluid seen.  Adnexa  No abnormality visualized ---------------------------------------------------------------------- Impression  Fetal growth restriction.  On previous fetal growth  assessment, the estimated fetal weight was at the 5th  percentile.  Patient does not have gestational diabetes.  Blood pressure today at our office is 111/62 mmHg.  On today's ultrasound, amniotic fluid is normal with good fetal  activity seen.  Fetal growth is appropriate for gestational age.  The estimated fetal weight is at the 12 percentile and the  femur length measurement is at the 1st percentile.  Amniotic  fluid is normal and good fetal activity seen.  Umbilical artery  Doppler showed normal for diastolic flow.  Antenatal testing is  reassuring.  BPP 8/8.  I reassured the patient of good interval fetal growth and  resolution of fetal growth restriction.  Ultrasound, however,  has limitations in accurately estimating fetal weights.  I counseled the patient with the help of Spanish language  interpreter present in the room.  ---------------------------------------------------------------------- Recommendations  -An appointment was made for her to return in 3 weeks for  fetal growth assessment. ----------------------------------------------------------------------                 Fredia Fresh, MD Electronically Signed Final Report   03/24/2024 05:13 pm ----------------------------------------------------------------------   US  MFM FETAL BPP WO NON STRESS Result Date: 03/17/2024 ----------------------------------------------------------------------  OBSTETRICS REPORT                       (Signed Final 03/17/2024 03:05 pm) ---------------------------------------------------------------------- Patient Info  ID #:       [id number]                          D.O.B.:  12-Jun-1991 (33 yrs)(F)  Name:       [name]                 Visit Date: 03/17/2024 01:16 pm ---------------------------------------------------------------------- Performed By  Attending:        Steffan Keys MD         Ref. Address:     [address]                                                             [place], [place]  [address]  Performed By:     Cosette Mor         Location[facility]         [facility]                                  [facility]  Referred By:      GLORIS DELENA HUGGER MD ---------------------------------------------------------------------- Orders  #  Description                           Code        Ordered By  1  US  MFM FETAL BPP WO NON               76819.01    BURK SCHAIBLE     STRESS  2  US  MFM UA CORD DOPPLER                76820.02    [facility] ----------------------------------------------------------------------  #  Order #                     Accession #                Episode #  1  [id number]                    [id number]                 [id number]  2  [id number]                   [id number]                 [id number] ---------------------------------------------------------------------- Indications  Maternal care for known or suspected poor      O36.5930  fetal growth, third trimester, single or  unspecified fetus IUGR  Advanced maternal age multigravida 72+,        O65.523  third trimester  [redacted] weeks gestation of pregnancy                Z3A.32  Encounter for other antenatal screening        Z36.2  follow-up  Low risk NIPS Neg AFP NEG Horizon, 3hr  GTT WNL ---------------------------------------------------------------------- Vital Signs  BP:          120/58 ---------------------------------------------------------------------- Fetal Evaluation  Num Of Fetuses:         1  Fetal Heart  Rate(bpm):  136  Cardiac Activity:       Observed  Presentation:           Cephalic  Placenta:               Posterior  P. Cord Insertion:      Previously seen  Amniotic Fluid  AFI FV:      Within normal limits  AFI Sum(cm)     %Tile       Largest Pocket(cm)  13.42           42          4.44  RUQ(cm)       RLQ(cm)       LUQ(cm)        LLQ(cm)  4.44          3.51          1.49           3.98 ---------------------------------------------------------------------- Biophysical Evaluation  Amniotic F.V:   Within normal limits       F. Tone:        Observed  F. Movement:    Observed                   Score:          8/8  F. Breathing:   Observed ---------------------------------------------------------------------- Biometry  LV:        5.3  mm ---------------------------------------------------------------------- OB History  Gravidity:    3         Term:   2 ---------------------------------------------------------------------- Gestational Age  Best:          32w 0d     Det. By:  Early Ultrasound         EDD:   05/12/24                                      (09/25/23) ----------------------------------------------------------------------  Anatomy  Ventricles:            Appears normal         Stomach:                Appears normal, left                                                                        sided  Heart:                 Appears normal         Kidneys:                Appear normal                         (4CH, axis, and                         situs)  Diaphragm:             Appears normal         Bladder:                Appears normal ----------------------------------------------------------------------  Doppler - Fetal Vessels  Umbilical Artery   S/D     %tile      RI    %tile      PI    %tile     PSV    ADFV    RDFV                                                     (cm/s)   3.35       83     0.7       84    1.16       90     43.6      No      No ---------------------------------------------------------------------- Comments  Sari Hayes Overland is currently at 32 weeks and 0 days.  She  has been followed due to IUGR and advanced maternal age.  She denies any problems since her last exam and reports  feeling fetal movements throughout the day.  A BPP performed today was 8/8.  There was normal amniotic fluid noted with a total AFI of  13.42 cm.  Doppler studies of the umbilical arteries performed due to  fetal growth restriction showed a normal S/D ratio of 3.35.  There were no signs of absent or reversed end-diastolic flow  noted today.  She will return in 1 week for another BPP, growth scan, and  umbilical artery Doppler study. ----------------------------------------------------------------------                   Steffan Keys, MD Electronically Signed Final Report   03/17/2024 03:05 pm ----------------------------------------------------------------------   US  MFM UA CORD DOPPLER Result Date: 03/17/2024 ----------------------------------------------------------------------  OBSTETRICS REPORT                       (Signed Final 03/17/2024 03:05 pm) ---------------------------------------------------------------------- Patient Info   ID #:       [id number]                          D.O.B.:  28-Mar-1991 (33 yrs)(F)  Name:       [name]                 Visit Date: 03/17/2024 01:16 pm ---------------------------------------------------------------------- Performed By  Attending:        Steffan Keys MD         Ref. Address:     [address]                                                             [place], [place]                                                             [address]  Performed By:     Cosette Mor         Location[facility]  [facility]                    BS [facility]                                  [facility]  Referred By:      GLORIS DELENA HUGGER MD ---------------------------------------------------------------------- Orders  #  Description                           Code        Ordered By  1  US  MFM FETAL BPP WO NON               76819.01    BURK SCHAIBLE     STRESS  2  US  MFM UA CORD DOPPLER                76820.02    [facility] ----------------------------------------------------------------------  #  Order #                     Accession #                Episode #  1  [id number]                   [id number]                 [id number]  2  [id number]                   [id number]                 [id number] ---------------------------------------------------------------------- Indications  Maternal care for known or suspected poor      O36.5930  fetal growth, third trimester, single or  unspecified fetus IUGR  Advanced maternal age multigravida 39+,        O59.523  third trimester  [redacted] weeks gestation of pregnancy                Z3A.32  Encounter for other antenatal screening        Z36.2  follow-up  Low risk NIPS Neg AFP NEG Horizon, 3hr  GTT WNL ---------------------------------------------------------------------- Vital Signs  BP:          120/58  ---------------------------------------------------------------------- Fetal Evaluation  Num Of Fetuses:         1  Fetal Heart Rate(bpm):  136  Cardiac Activity:       Observed  Presentation:           Cephalic  Placenta:               Posterior  P. Cord Insertion:      Previously seen  Amniotic Fluid  AFI FV:      Within normal limits  AFI Sum(cm)     %Tile       Largest Pocket(cm)  13.42           42          4.44  RUQ(cm)       RLQ(cm)       LUQ(cm)        LLQ(cm)  4.44          3.51          1.49           3.98 ---------------------------------------------------------------------- Biophysical Evaluation  Amniotic F.V:   Within normal limits       F. Tone:        Observed  F. Movement:    Observed                   Score:          8/8  F. Breathing:   Observed ---------------------------------------------------------------------- Biometry  LV:        5.3  mm ---------------------------------------------------------------------- OB History  Gravidity:    3         Term:   2 ---------------------------------------------------------------------- Gestational Age  Best:          32w 0d     Det. By:  Early Ultrasound         EDD:   05/12/24                                      (09/25/23) ---------------------------------------------------------------------- Anatomy  Ventricles:            Appears normal         Stomach:                Appears normal, left                                                                        sided  Heart:                 Appears normal         Kidneys:                Appear normal                         (4CH, axis, and                         situs)  Diaphragm:             Appears normal         Bladder:                Appears normal ---------------------------------------------------------------------- Doppler - Fetal Vessels  Umbilical Artery   S/D     %tile      RI    %tile  PI    %tile     PSV    ADFV    RDFV                                                     (cm/s)   3.35        83     0.7       84    1.16       90     43.6      No      No ---------------------------------------------------------------------- Comments  Amalie Diaz Soto is currently at 32 weeks and 0 days.  She  has been followed due to IUGR and advanced maternal age.  She denies any problems since her last exam and reports  feeling fetal movements throughout the day.  A BPP performed today was 8/8.  There was normal amniotic fluid noted with a total AFI of  13.42 cm.  Doppler studies of the umbilical arteries performed due to  fetal growth restriction showed a normal S/D ratio of 3.35.  There were no signs of absent or reversed end-diastolic flow  noted today.  She will return in 1 week for another BPP, growth scan, and  umbilical artery Doppler study. ----------------------------------------------------------------------                   Steffan Keys, MD Electronically Signed Final Report   03/17/2024 03:05 pm ----------------------------------------------------------------------   US  MFM UA CORD DOPPLER Result Date: 03/10/2024 ----------------------------------------------------------------------  OBSTETRICS REPORT                       (Signed Final 03/10/2024 03:24 pm) ---------------------------------------------------------------------- Patient Info  ID #:       [id number]                          D.O.B.:  1990/08/22 (33 yrs)(F)  Name:       [name]                 Visit Date: 03/10/2024 10:52 am ---------------------------------------------------------------------- Performed By  Attending:        Fredia Fresh MD        Ref. Address:     [address]                                                             [place], [place]                                                             [address]  Performed By:     Cosette Mor         Location[facility]         [facility]  [facility]  Referred By:      GLORIS DELENA HUGGER MD ---------------------------------------------------------------------- Orders  #  Description                           Code        Ordered By  1  US  MFM UA CORD DOPPLER                76820.02    RAVI SHANKAR  2  US  MFM OB LIMITED                     76815.01    RA[facility] ----------------------------------------------------------------------  #  Order #                     Accession #                Episode #  1  [id number]                   [id number]                 [id number]  2  [id number]                   [id number]                 [id number] ---------------------------------------------------------------------- Indications  Maternal care for known or suspected poor      O36.5930  fetal growth, third trimester, single or  unspecified fetus IUGR  Advanced maternal age multigravida 7+,        O62.522  third trimester (33)  Encounter for other antenatal screening        Z36.2  follow-up  Low risk NIPS Neg AFP NEG Horizon, 3hr  GTT WNL  [redacted] weeks gestation of pregnancy                Z3A.31 ---------------------------------------------------------------------- Vital Signs  BP:          113/57 ---------------------------------------------------------------------- Fetal Evaluation  Num Of Fetuses:         1  Fetal Heart Rate(bpm):  131  Cardiac Activity:       Observed  Presentation:           Cephalic  Placenta:               Posterior  P. Cord Insertion:      Previously seen  Amniotic Fluid  AFI FV:      Within normal limits  AFI Sum(cm)     %Tile       Largest Pocket(cm)  12.67           36          4.82  RUQ(cm)       RLQ(cm)       LUQ(cm)        LLQ(cm)  4.4           4.82          0              3.45 ---------------------------------------------------------------------- Biometry  LV:        5.5  mm ---------------------------------------------------------------------- OB History   Gravidity:    3         Term:   2 ---------------------------------------------------------------------- Gestational Age  Best:          31w 0d     Det. By:  Early Ultrasound         EDD:   05/12/24                                      (09/25/23) ---------------------------------------------------------------------- Anatomy  Ventricles:            Appears normal         Stomach:                Appears normal, left                                                                        sided  Heart:                 Appears normal         Kidneys:                Appear normal                         (4CH, axis, and                         situs)  Diaphragm:             Appears normal         Bladder:                Appears normal ---------------------------------------------------------------------- Doppler - Fetal Vessels  Umbilical Artery   S/D     %tile      RI    %tile      PI    %tile     PSV                                                     (cm/s)   3.51       85    0.72       87    1.19       91    54.01 ---------------------------------------------------------------------- Cervix Uterus Adnexa  Right Ovary  Size(cm)     2.57   x   2.35  x  2.07      Vol(ml): 6.55  Left Ovary  Size(cm)       2.9  x   2.72   x  1.63      Vol(ml): 6.73 ---------------------------------------------------------------------- Impression  Fetal growth restriction.  On ultrasound performed last week,  the estimated fetal weight was at the 5th percentile.  On today's ultrasound amniotic fluid is normal and good fetal  activity seen.  Umbilical artery Doppler showed normal  forward diastolic flow.  NST is reactive.  Blood pressure today at our office is 113/57 mmHg.  I reassured the patient of the findings. ---------------------------------------------------------------------- Recommendations  Continue weekly antenatal testing till delivery . ----------------------------------------------------------------------                 Fredia Fresh, MD Electronically Signed Final Report   03/10/2024 03:24 pm ----------------------------------------------------------------------   US  MFM OB LIMITED Result Date: 03/10/2024 ----------------------------------------------------------------------  OBSTETRICS REPORT                       (Signed Final 03/10/2024 03:24 pm) ---------------------------------------------------------------------- Patient Info  ID #:       [id number]                          D.O.B.:  04/26/1991 (33 yrs)(F)  Name:       [name]                 Visit Date: 03/10/2024 10:52 am ---------------------------------------------------------------------- Performed By  Attending:        Fredia Fresh MD        Ref. Address:     [address]                                                             [place], [place]                                                             [address]  Performed By:     Cosette Mor         Location[facility]         [facility]                    BS [facility]                                  [facility]  Referred By:      GLORIS DELENA HUGGER MD ---------------------------------------------------------------------- Orders  #  Description                           Code        Ordered By  1  US  MFM UA CORD DOPPLER                76820.02    RAVI SHANKAR  2  US  MFM OB LIMITED                     76815.01    RAVI [facility] ----------------------------------------------------------------------  #  Order #                     Accession #                Episode #  1  [id number]                   [id number]                 [id number]  2  [id number]                   [id number]                 [id number] ---------------------------------------------------------------------- Indications  Maternal care for known or suspected poor      O36.5930  fetal growth, third trimester,  single or  unspecified fetus IUGR  Advanced maternal age multigravida 33+,        O34.522  third trimester (33)  Encounter for other antenatal screening        Z36.2  follow-up  Low risk NIPS Neg AFP NEG Horizon, 3hr  GTT WNL  [redacted] weeks gestation of pregnancy                Z3A.31 ---------------------------------------------------------------------- Vital Signs  BP:          113/57 ---------------------------------------------------------------------- Fetal Evaluation  Num Of Fetuses:         1  Fetal Heart Rate(bpm):  131  Cardiac Activity:       Observed  Presentation:           Cephalic  Placenta:               Posterior  P. Cord Insertion:      Previously seen  Amniotic Fluid  AFI FV:      Within normal limits  AFI Sum(cm)     %Tile       Largest Pocket(cm)  12.67           36          4.82  RUQ(cm)       RLQ(cm)       LUQ(cm)        LLQ(cm)  4.4           4.82          0              3.45 ---------------------------------------------------------------------- Biometry  LV:        5.5  mm ---------------------------------------------------------------------- OB History  Gravidity:    3         Term:   2 ---------------------------------------------------------------------- Gestational Age  Best:  31w 0d     Det. By:  Early Ultrasound         EDD:   05/12/24                                      (09/25/23) ---------------------------------------------------------------------- Anatomy  Ventricles:            Appears normal         Stomach:                Appears normal, left                                                                        sided  Heart:                 Appears normal         Kidneys:                Appear normal                         (4CH, axis, and                         situs)  Diaphragm:             Appears normal         Bladder:                Appears normal ---------------------------------------------------------------------- Doppler - Fetal Vessels  Umbilical Artery   S/D      %tile      RI    %tile      PI    %tile     PSV                                                     (cm/s)   3.51       85    0.72       87    1.19       91    54.01 ---------------------------------------------------------------------- Cervix Uterus Adnexa  Right Ovary  Size(cm)     2.57   x   2.35   x  2.07      Vol(ml): 6.55  Left Ovary  Size(cm)       2.9  x   2.72   x  1.63      Vol(ml): 6.73 ---------------------------------------------------------------------- Impression  Fetal growth restriction.  On ultrasound performed last week,  the estimated fetal weight was at the 5th percentile.  On today's ultrasound amniotic fluid is normal and good fetal  activity seen.  Umbilical artery Doppler showed normal  forward diastolic flow.  NST is reactive.  Blood pressure today at our office is 113/57 mmHg.  I reassured the patient of the findings. ---------------------------------------------------------------------- Recommendations  Continue weekly antenatal testing till delivery . ----------------------------------------------------------------------  Fredia Fresh, MD Electronically Signed Final Report   03/10/2024 03:24 pm ----------------------------------------------------------------------   US  MFM OB FOLLOW UP Result Date: 03/03/2024 ----------------------------------------------------------------------  OBSTETRICS REPORT                       (Signed Final 03/03/2024 09:05 am) ---------------------------------------------------------------------- Patient Info  ID #:       [id number]                          D.O.B.:  03/20/1991 (33 yrs)(F)  Name:       [name]                 Visit Date: 03/03/2024 07:19 am ---------------------------------------------------------------------- Performed By  Attending:        Delora Smaller DO       Ref. Address:     [address]                                                             [place], [place]                                                              [address]  Performed By:     Comer Harrow       Location:         [facility]                    RDMS                                     [facility]  Referred By:      GLORIS DELENA HUGGER MD ---------------------------------------------------------------------- Orders  #  Description                           Code        Ordered By  1  US  MFM OB FOLLOW UP                   23183.98    RAVI SHANKAR  2  US   MFM UA CORD DOPPLER                76820.02    [facility] ----------------------------------------------------------------------  #  Order #                     Accession #                Episode #  1  [id number]                   [id number]                 [id number]  2  [id number]                   [id number]                 [id number] ---------------------------------------------------------------------- Indications  Maternal care for known or suspected poor      O36.5930  fetal growth, third trimester, single or  unspecified fetus IUGR  Advanced maternal age multigravida 33+,        O67.522  third trimester (33)  Encounter for other antenatal screening        Z36.2  follow-up  [redacted] weeks gestation of pregnancy                Z3A.30  Low risk NIPS Neg AFP NEG Horizon, 3hr  GTT WNL ---------------------------------------------------------------------- Vital Signs  BP:          105/56 ---------------------------------------------------------------------- Fetal Evaluation  Num Of Fetuses:         1  Fetal Heart Rate(bpm):  142  Cardiac Activity:       Observed  Presentation:           Cephalic  Placenta:               Posterior  P. Cord Insertion:      Previously seen  Amniotic Fluid  AFI FV:      Within normal limits  AFI Sum(cm)     %Tile       Largest Pocket(cm)  12.96           37          4.34  RUQ(cm)       RLQ(cm)       LUQ(cm)        LLQ(cm)  3.28           3.63          1.71           4.34 ---------------------------------------------------------------------- Biometry  BPD:      76.1  mm     G. Age:  30w 4d         55  %    CI:        76.72   %    70 - 86                                                          FL/HC:      18.6   %    19.2 - 21.4  HC:      275.2  mm     G. Age:  30w 1d         18  %  HC/AC:      1.13        0.99 - 1.21  AC:      244.2  mm     G. Age:  28w 5d         12  %    FL/BPD:     67.4   %    71 - 87  FL:       51.3  mm     G. Age:  27w 3d        < 1  %    FL/AC:      21.0   %    20 - 24  LV:        4.8  mm  Est. FW:    1240  gm    2 lb 12 oz       5  % ---------------------------------------------------------------------- OB History  Gravidity:    3         Term:   2 ---------------------------------------------------------------------- Gestational Age  U/S Today:     29w 2d                                        EDD:   05/17/24  Best:          30w 0d     Det. By:  Early Ultrasound         EDD:   05/12/24                                      (09/25/23) ---------------------------------------------------------------------- Anatomy  Cranium:               Appears normal         Aortic Arch:            Previously seen  Cavum:                 Previously seen        Ductal Arch:            Previously seen  Ventricles:            Appears normal         Diaphragm:              Appears normal  Choroid Plexus:        Previously seen        Stomach:                Appears normal, left                                                                        sided  Cerebellum:            Previously seen        Abdomen:                Previously seen  Posterior Fossa:       Previously seen        Abdominal Wall:  Previously seen  Face:                  Orbits and profile     Cord Vessels:           Previously seen                         previously seen  Lips:                  Previously seen        Kidneys:                Appear normal   Thoracic:              Previously seen        Bladder:                Appears normal  Heart:                 Appears normal         Spine:                  Previously seen                         (4CH, axis, and                         situs)  RVOT:                  Previously seen        Upper Extremities:      Previously seen  LVOT:                  Previously seen        Lower Extremities:      Previously seen  Other:  Female gender previously seen. Fetal anatomic survey complete on          prior scans. ---------------------------------------------------------------------- Doppler - Fetal Vessels  Umbilical Artery   S/D     %tile      RI    %tile      PI    %tile     PSV    ADFV    RDFV                                                     (cm/s)    3.8       90    0.74       90    1.31       96     53.3      No      No ---------------------------------------------------------------------- Cervix Uterus Adnexa  Cervix  Not visualized (advanced GA >24wks)  Uterus  No abnormality visualized.  Right Ovary  Size(cm)        3   x   1.58   x  1.81      Vol(ml): 4.49  Within normal limits.  Left Ovary  Size(cm)     3.11   x   2.5    x  1.52      Vol(ml): 6.19  Within normal limits.  Cul De Sac  No free fluid  seen.  Adnexa  No abnormality visualized ---------------------------------------------------------------------- Comments  Maternal Fetal medicine Consult  [facility] [name] is a 33 y.o. G3P2002 at [redacted]w[redacted]d here for  ultrasound and consultation. Akane [name] is doing  well today with no acute concerns. Today we focused on the  following:  FGR: The patient is here due to fetal growth restriction  previously at the 2.3% overall with AC at the 3rd percentile.  Umbilical artery Dopplers were normal.  Today the fetal  growth is slightly improved at the 5th percentile overall in the  12th percentile at the [facility].  The umbilical artery Dopplers are  at the upper limit of normal with a few elevated over the 95th  percentile.  NST  was reactive.  She reports good fetal  movement.  The patient had time to ask questions that were answered to  her satisfaction.  She verbalized understanding and agrees to  proceed with the plan below.  Sonographic findings  Single intrauterine pregnancy at 30w 0d.  Fetal cardiac activity:  Observed and appears normal.  Presentation: Cephalic.  Interval fetal anatomy appears normal.  Fetal biometry shows the estimated fetal weight at the 5  percentile and the abdominal circumference at the 12  percentile.  Amniotic fluid: Within normal limits.  MVP: 4.34 cm.  Placenta: Posterior.  Umbilical artery dopplers findings:  -S/D:3.8 which are in the 90th percentile at this gestational  age.  -Absent end-diastolic flow: No.  -Reversed end-diastolic flow:  No.  There are limitations of prenatal ultrasound such as the  inability to detect certain abnormalities due to poor  visualization. Various factors such as fetal position,  gestational age and maternal body habitus may increase the  difficulty in visualizing the fetal anatomy.  Recommendations  - Continue weekly UA dopplers and antenatal testing until  delivery.  - Serial growth US  every 3 weeks until delivery. ----------------------------------------------------------------------                 Delora Smaller, DO Electronically Signed Final Report   03/03/2024 09:05 am ----------------------------------------------------------------------   US  MFM UA CORD DOPPLER Result Date: 03/03/2024 ----------------------------------------------------------------------  OBSTETRICS REPORT                       (Signed Final 03/03/2024 09:05 am) ---------------------------------------------------------------------- Patient Info  ID #:       [id number]                          D.O.B.:  11-15-1990 (33 yrs)(F)  Name:       [facility] [name]                 Visit Date: 03/03/2024 07:19 am ---------------------------------------------------------------------- Performed By  Attending:        Delora Smaller DO       Ref. Address:     [address]                                                             [place], [place]  [address]  Performed By:     Comer Harrow       [facility]  Referred By:      GLORIS DELENA HUGGER MD ---------------------------------------------------------------------- Orders  #  Description                           Code        Ordered By  1  US  MFM OB FOLLOW UP                   76816.01    RAVI SHANKAR  2  US  MFM UA CORD DOPPLER                76820.02    RAVI [facility] ----------------------------------------------------------------------  #  Order #                     Accession #                Episode #  1  [id number]                   [id number]                 [id number]  2  [id number]                   [id number]                 [id number] ---------------------------------------------------------------------- Indications  Maternal care for known or suspected poor      O36.5930  fetal growth, third trimester, single or  unspecified fetus IUGR  Advanced maternal age multigravida 74+,        O34.522  third trimester (35)  Encounter for other antenatal screening        Z36.2  follow-up  [redacted] weeks gestation of pregnancy                Z3A.30  Low risk NIPS Neg AFP NEG Horizon, 3hr  GTT WNL ---------------------------------------------------------------------- Vital Signs  BP:          105/56 ---------------------------------------------------------------------- Fetal Evaluation  Num Of Fetuses:         1  Fetal Heart Rate(bpm):  142  Cardiac Activity:       Observed  Presentation:           Cephalic  Placenta:               Posterior  P. Cord Insertion:      Previously seen   Amniotic Fluid  AFI FV:      Within normal limits  AFI Sum(cm)     %Tile       Largest Pocket(cm)  12.96           37          4.34  RUQ(cm)       RLQ(cm)       LUQ(cm)        LLQ(cm)  3.28          3.63          1.71           4.34 ---------------------------------------------------------------------- Biometry  BPD:      76.1  mm     G. Age:  30w 4d         55  %    CI:        76.72   %    70 - 86                                                          FL/HC:      18.6   %    19.2 - 21.4  HC:      275.2  mm     G. Age:  30w 1d         18  %    HC/AC:      1.13        0.99 - 1.21  AC:      244.2  mm     G. Age:  28w 5d         12  %    FL/BPD:     67.4   %    71 - 87  FL:       51.3  mm     G. Age:  27w 3d        < 1  %    FL/AC:      21.0   %    20 - 24  LV:        4.8  mm  Est. FW:    1240  gm    2 lb 12 oz       5  % ---------------------------------------------------------------------- OB History  Gravidity:    3         Term:   2 ---------------------------------------------------------------------- Gestational Age  U/S Today:     29w 2d                                        EDD:   05/17/24  Best:          30w 0d     Det. By:  Early Ultrasound         EDD:   05/12/24                                      (  09/25/23) ---------------------------------------------------------------------- Anatomy  Cranium:               Appears normal         Aortic Arch:            Previously seen  Cavum:                 Previously seen        Ductal Arch:            Previously seen  Ventricles:            Appears normal         Diaphragm:              Appears normal  Choroid Plexus:        Previously seen        Stomach:                Appears normal, left                                                                        sided  Cerebellum:            Previously seen        Abdomen:                Previously seen  Posterior Fossa:       Previously seen        Abdominal Wall:         Previously seen  Face:                   Orbits and profile     Cord Vessels:           Previously seen                         previously seen  Lips:                  Previously seen        Kidneys:                Appear normal  Thoracic:              Previously seen        Bladder:                Appears normal  Heart:                 Appears normal         Spine:                  Previously seen                         (4CH, axis, and                         situs)  RVOT:                  Previously seen        Upper Extremities:      Previously seen  LVOT:  Previously seen        Lower Extremities:      Previously seen  Other:  Female gender previously seen. Fetal anatomic survey complete on          prior scans. ---------------------------------------------------------------------- Doppler - Fetal Vessels  Umbilical Artery   S/D     %tile      RI    %tile      PI    %tile     PSV    ADFV    RDFV                                                     (cm/s)    3.8       90    0.74       90    1.31       96     53.3      No      No ---------------------------------------------------------------------- Cervix Uterus Adnexa  Cervix  Not visualized (advanced GA >24wks)  Uterus  No abnormality visualized.  Right Ovary  Size(cm)        3   x   1.58   x  1.81      Vol(ml): 4.49  Within normal limits.  Left Ovary  Size(cm)     3.11   x   2.5    x  1.52      Vol(ml): 6.19  Within normal limits.  Cul De Sac  No free fluid seen.  Adnexa  No abnormality visualized ---------------------------------------------------------------------- Comments  Maternal Fetal medicine Consult  Katrice [name] is a 33 y.o. G3P2002 at [redacted]w[redacted]d here for  ultrasound and consultation. Jacoria [name] is doing  well today with no acute concerns. Today we focused on the  following:  FGR: The patient is here due to fetal growth restriction  previously at the 2.3% overall with AC at the 3rd percentile.  Umbilical artery Dopplers were normal.  Today the fetal  growth is slightly  improved at the 5th percentile overall in the  12th percentile at the [facility].  The umbilical artery Dopplers are  at the upper limit of normal with a few elevated over the 95th  percentile.  NST was reactive.  She reports good fetal  movement.  The patient had time to ask questions that were answered to  her satisfaction.  She verbalized understanding and agrees to  proceed with the plan below.  Sonographic findings  Single intrauterine pregnancy at 30w 0d.  Fetal cardiac activity:  Observed and appears normal.  Presentation: Cephalic.  Interval fetal anatomy appears normal.  Fetal biometry shows the estimated fetal weight at the 5  percentile and the abdominal circumference at the 12  percentile.  Amniotic fluid: Within normal limits.  MVP: 4.34 cm.  Placenta: Posterior.  Umbilical artery dopplers findings:  -S/D:3.8 which are in the 90th percentile at this gestational  age.  -Absent end-diastolic flow: No.  -Reversed end-diastolic flow:  No.  There are limitations of prenatal ultrasound such as the  inability to detect certain abnormalities due to poor  visualization. Various factors such as fetal position,  gestational age and maternal body habitus may increase the  difficulty in visualizing the fetal anatomy.  Recommendations  - Continue weekly UA dopplers and antenatal testing until  delivery.  -  Serial growth US  every 3 weeks until delivery. ----------------------------------------------------------------------                 Delora Smaller, DO Electronically Signed Final Report   03/03/2024 09:05 am ----------------------------------------------------------------------    Assessment and Plan:  Pregnancy: G3P2002 at [redacted]w[redacted]d 1. Fetal growth restriction antepartum (Primary) EFW 12%, follow up MFM recommendations.    2. Multigravida of advanced maternal age in third trimester 3. [redacted] weeks gestation of pregnancy 4. Supervision of high risk pregnancy, antepartum Aspirin  and PNV refilled. Gave letter to get Flu  vaccine at [facility] Third trimester expectations reviewed and all questions answered. Preterm labor symptoms and general obstetric precautions including but not limited to vaginal bleeding, contractions, leaking of fluid and fetal movement were reviewed in detail with the patient. Please refer to After Visit Summary for other counseling recommendations.   Return in about 4 weeks (around 04/29/2024) for Pelvic cultures, OFFICE OB VISIT (MD only).  Future Appointments  Date Time Provider Department Center  04/14/2024  8:55 AM Izell Harari, MD [facility]  04/14/2024  3:30 PM [facility] PROVIDER 1 [facility]  04/14/2024  3:45 PM [facility] US5 [facility]  04/21/2024  3:15 PM Cashion, Barkley CROME, MD [facility]  04/28/2024 10:15 AM Cleatus Moccasin, MD [facility]  05/03/2024 10:55 AM Nicholaus Burnard HERO, MD [facility]  05/10/2024  3:15 PM Izell Harari, MD [facility]    Gloris Hugger, MD

## 2024-04-14 ENCOUNTER — Ambulatory Visit (INDEPENDENT_AMBULATORY_CARE_PROVIDER_SITE_OTHER): Payer: Self-pay | Admitting: Obstetrics and Gynecology

## 2024-04-14 ENCOUNTER — Other Ambulatory Visit: Payer: Self-pay

## 2024-04-14 ENCOUNTER — Ambulatory Visit (HOSPITAL_BASED_OUTPATIENT_CLINIC_OR_DEPARTMENT_OTHER): Payer: Self-pay

## 2024-04-14 ENCOUNTER — Ambulatory Visit: Payer: Self-pay | Attending: Obstetrics and Gynecology | Admitting: Obstetrics and Gynecology

## 2024-04-14 ENCOUNTER — Other Ambulatory Visit (HOSPITAL_COMMUNITY)
Admission: RE | Admit: 2024-04-14 | Discharge: 2024-04-14 | Disposition: A | Discharge status: Home / Self Care | Payer: Self-pay | Source: Ambulatory Visit | Attending: Obstetrics and Gynecology | Admitting: Obstetrics and Gynecology

## 2024-04-14 VITALS — BP 123/62 | HR 70

## 2024-04-14 VITALS — BP 118/68 | HR 104 | Wt 178.0 lb

## 2024-04-14 DIAGNOSIS — L80 Vitiligo: Secondary | ICD-10-CM

## 2024-04-14 DIAGNOSIS — Z3A36 36 weeks gestation of pregnancy: Secondary | ICD-10-CM

## 2024-04-14 DIAGNOSIS — Z603 Acculturation difficulty: Secondary | ICD-10-CM

## 2024-04-14 DIAGNOSIS — Z362 Encounter for other antenatal screening follow-up: Secondary | ICD-10-CM | POA: Insufficient documentation

## 2024-04-14 DIAGNOSIS — O0993 Supervision of high risk pregnancy, unspecified, third trimester: Secondary | ICD-10-CM

## 2024-04-14 DIAGNOSIS — O365990 Maternal care for other known or suspected poor fetal growth, unspecified trimester, not applicable or unspecified: Secondary | ICD-10-CM

## 2024-04-14 DIAGNOSIS — O09523 Supervision of elderly multigravida, third trimester: Secondary | ICD-10-CM

## 2024-04-14 DIAGNOSIS — O365930 Maternal care for other known or suspected poor fetal growth, third trimester, not applicable or unspecified: Secondary | ICD-10-CM

## 2024-04-14 DIAGNOSIS — Z3493 Encounter for supervision of normal pregnancy, unspecified, third trimester: Secondary | ICD-10-CM | POA: Insufficient documentation

## 2024-04-14 DIAGNOSIS — O0990 Supervision of high risk pregnancy, unspecified, unspecified trimester: Secondary | ICD-10-CM

## 2024-04-14 DIAGNOSIS — Z758 Other problems related to medical facilities and other health care: Secondary | ICD-10-CM

## 2024-04-14 NOTE — Progress Notes (Signed)
 After review, MFM consult with provider is not indicated for today  Betty Ranks, MD 04/14/2024 6:49 PM  [facility]

## 2024-04-14 NOTE — Progress Notes (Signed)
   PRENATAL VISIT NOTE  Subjective:  Betty [name] is a 33 y.o. G3P2002 at [redacted]w[redacted]d being seen today for ongoing prenatal care.  She is currently monitored for the following issues for this high-risk pregnancy and has Vitiligo; AMA (advanced maternal age) multigravida 35+; Fetal growth restriction antepartum; Supervision of high risk pregnancy, antepartum; and Language barrier on their problem list.  Patient reports no complaints.  Contractions: Not present. Vag. Bleeding: None.  Movement: Present. Denies leaking of fluid.   The following portions of the patient's history were reviewed and updated as appropriate: allergies, current medications, past family history, past medical history, past social history, past surgical history and problem list.   Objective:    Vitals:   04/14/24 0905  BP: 118/68  Pulse: (!) 104  Weight: 178 lb (80.7 kg)    Fetal Status:  Fetal Heart Rate (bpm): 138   Movement: Present    General: Alert, oriented and cooperative. Patient is in no acute distress.  Skin: Skin is warm and dry. No rash noted.   Cardiovascular: Normal heart rate noted  Respiratory: Normal respiratory effort, no problems with respiration noted  Abdomen: Soft, gravid, appropriate for gestational age.  Pain/Pressure: Absent     Pelvic: Cervical exam deferred        Extremities: Normal range of motion.  Edema: None  Mental Status: Normal mood and affect. Normal behavior. Normal judgment and thought content.   Assessment and Plan:  Pregnancy: G3P2002 at [redacted]w[redacted]d 1. [redacted] weeks gestation of pregnancy (Primary) Self pay. D/w her re: BTL process - GC/Chlamydia probe amp ([place])not at [facility] - Culture, beta strep (group b only)  2. Language barrier In person interpretre  3. Fetal growth restriction antepartum Has follow up scan today. Will set up IOL based on recs later today 8/28: efw 12% 1840g, ac 25%, UA wnl, bpp 8/8, afi 14, ceph ***  4. Multigravida of advanced maternal age in  third trimester No issues  5. Supervision of high risk pregnancy, antepartum  6. Vitiligo Patient applying for [facility] assistance. Can refer to derm postpartum  Preterm labor symptoms and general obstetric precautions including but not limited to vaginal bleeding, contractions, leaking of fluid and fetal movement were reviewed in detail with the patient. Please refer to After Visit Summary for other counseling recommendations.   No follow-ups on file.  Future Appointments  Date Time Provider Department Center  04/14/2024  3:30 PM [facility] PROVIDER 1 [facility]  04/14/2024  3:45 PM [facility] US5 [facility]  04/21/2024  3:15 PM Cashion, Barkley CROME, MD [facility]  04/28/2024 10:15 AM Cleatus Moccasin, MD [facility]  05/03/2024 10:55 AM Nicholaus Burnard HERO, MD [facility]  05/10/2024  3:15 PM Izell Harari, MD [facility]    Harari Izell, MD

## 2024-04-15 LAB — GC/CHLAMYDIA PROBE AMP (~~LOC~~) NOT AT ARMC
Chlamydia: NEGATIVE
Comment: NEGATIVE
Comment: NORMAL
Neisseria Gonorrhea: NEGATIVE

## 2024-04-18 LAB — CULTURE, BETA STREP (GROUP B ONLY): Strep Gp B Culture: NEGATIVE

## 2024-04-21 ENCOUNTER — Ambulatory Visit: Payer: Self-pay | Admitting: Obstetrics and Gynecology

## 2024-04-21 ENCOUNTER — Other Ambulatory Visit: Payer: Self-pay

## 2024-04-21 VITALS — BP 115/75 | HR 84 | Wt 179.0 lb

## 2024-04-21 DIAGNOSIS — O365990 Maternal care for other known or suspected poor fetal growth, unspecified trimester, not applicable or unspecified: Secondary | ICD-10-CM

## 2024-04-21 DIAGNOSIS — Z603 Acculturation difficulty: Secondary | ICD-10-CM

## 2024-04-21 DIAGNOSIS — O09523 Supervision of elderly multigravida, third trimester: Secondary | ICD-10-CM

## 2024-04-21 DIAGNOSIS — O365930 Maternal care for other known or suspected poor fetal growth, third trimester, not applicable or unspecified: Secondary | ICD-10-CM

## 2024-04-21 DIAGNOSIS — O0990 Supervision of high risk pregnancy, unspecified, unspecified trimester: Secondary | ICD-10-CM

## 2024-04-21 DIAGNOSIS — Z3A37 37 weeks gestation of pregnancy: Secondary | ICD-10-CM

## 2024-04-21 DIAGNOSIS — Z758 Other problems related to medical facilities and other health care: Secondary | ICD-10-CM

## 2024-04-21 DIAGNOSIS — O0993 Supervision of high risk pregnancy, unspecified, third trimester: Secondary | ICD-10-CM

## 2024-04-21 NOTE — Progress Notes (Signed)
   PRENATAL VISIT NOTE  Subjective:  Betty [name] is a 33 y.o. G3P2002 at [redacted]w[redacted]d being seen today for ongoing prenatal care.  She is currently monitored for the following issues for this high-risk pregnancy and has Vitiligo; AMA (advanced maternal age) multigravida 35+; Fetal growth restriction antepartum; Supervision of high risk pregnancy, antepartum; and Language barrier on their problem list.  Patient reports no complaints.  Contractions: Not present. Vag. Bleeding: None.  Movement: Present. Denies leaking of fluid.   The following portions of the patient's history were reviewed and updated as appropriate: allergies, current medications, past family history, past medical history, past social history, past surgical history and problem list.   Objective:    Vitals:   04/21/24 1509  BP: 115/75  Pulse: 84  Weight: 179 lb (81.2 kg)    Fetal Status:  Fetal Heart Rate (bpm): 138   Movement: Present    General: Alert, oriented and cooperative. Patient is in no acute distress.  Skin: Skin is warm and dry. No rash noted.   Cardiovascular: Normal heart rate noted  Respiratory: Normal respiratory effort, no problems with respiration noted  Abdomen: Soft, gravid, appropriate for gestational age.  Pain/Pressure: Absent     Pelvic: Cervical exam deferred        Extremities: Normal range of motion.  Edema: None  Mental Status: Normal mood and affect. Normal behavior. Normal judgment and thought content.   Assessment and Plan:  Pregnancy: G3P2002 at [redacted]w[redacted]d 1. [redacted] weeks gestation of pregnancy (Primary) GBS negative Patient normotensive with good fetal heart tones. Follow up in 1 week  Patient states she cash paid for pp BTL and wants to ensure that it gets scheduled.   2. Language barrier In person interpreter present  3. Supervision of high risk pregnancy, antepartum   4. Multigravida of advanced maternal age in third trimester On ASA  5. Fetal growth restriction antepartum  (resolved) 9/18: EFW 2522g 21%ile, AC 31%, cephalic.   Term labor symptoms and general obstetric precautions including but not limited to vaginal bleeding, contractions, leaking of fluid and fetal movement were reviewed in detail with the patient. Please refer to After Visit Summary for other counseling recommendations.   No follow-ups on file.  Future Appointments  Date Time Provider Department Center  04/28/2024 10:15 AM Cleatus Moccasin, MD [facility]  05/03/2024 10:55 AM Nicholaus Burnard HERO, MD [facility]  05/10/2024  3:15 PM Izell Harari, MD [facility]    Ailsa Mireles LITTIE Angles, MD

## 2024-04-28 ENCOUNTER — Encounter: Payer: Self-pay | Admitting: Obstetrics and Gynecology

## 2024-04-28 ENCOUNTER — Other Ambulatory Visit: Payer: Self-pay

## 2024-04-28 ENCOUNTER — Ambulatory Visit (INDEPENDENT_AMBULATORY_CARE_PROVIDER_SITE_OTHER): Payer: Self-pay | Admitting: Obstetrics and Gynecology

## 2024-04-28 VITALS — BP 117/64 | HR 72 | Wt 178.0 lb

## 2024-04-28 DIAGNOSIS — O365990 Maternal care for other known or suspected poor fetal growth, unspecified trimester, not applicable or unspecified: Secondary | ICD-10-CM

## 2024-04-28 DIAGNOSIS — O365930 Maternal care for other known or suspected poor fetal growth, third trimester, not applicable or unspecified: Secondary | ICD-10-CM

## 2024-04-28 DIAGNOSIS — Z3A38 38 weeks gestation of pregnancy: Secondary | ICD-10-CM

## 2024-04-28 DIAGNOSIS — O0993 Supervision of high risk pregnancy, unspecified, third trimester: Secondary | ICD-10-CM

## 2024-04-28 DIAGNOSIS — R7309 Other abnormal glucose: Secondary | ICD-10-CM | POA: Insufficient documentation

## 2024-04-28 DIAGNOSIS — O0990 Supervision of high risk pregnancy, unspecified, unspecified trimester: Secondary | ICD-10-CM

## 2024-04-28 DIAGNOSIS — Z603 Acculturation difficulty: Secondary | ICD-10-CM

## 2024-04-28 DIAGNOSIS — Z758 Other problems related to medical facilities and other health care: Secondary | ICD-10-CM

## 2024-04-28 NOTE — Progress Notes (Addendum)
   PRENATAL VISIT NOTE  Subjective:  Betty [name] is a 33 y.o. G3P2002 at [redacted]w[redacted]d being seen today for ongoing prenatal care.  She is currently monitored for the following issues for this low-risk pregnancy and has Vitiligo; AMA (advanced maternal age) multigravida 35+; Fetal growth restriction antepartum; Supervision of high risk pregnancy, antepartum; Language barrier; and Abnormal glucose tolerance test on their problem list.  Patient reports no complaints.  Contractions: Not present. Vag. Bleeding: None.  Movement: Present. Denies leaking of fluid.   The following portions of the patient's history were reviewed and updated as appropriate: allergies, current medications, past family history, past medical history, past social history, past surgical history and problem list.   Objective:    Vitals:   04/28/24 1036  BP: 117/64  Pulse: 72  Weight: 178 lb (80.7 kg)    Fetal Status:  Fetal Heart Rate (bpm): 146   Movement: Present    General: Alert, oriented and cooperative. Patient is in no acute distress.  Skin: Skin is warm and dry. No rash noted.   Cardiovascular: Normal heart rate noted  Respiratory: Normal respiratory effort, no problems with respiration noted  Abdomen: Soft, gravid, appropriate for gestational age.  Pain/Pressure: Absent     Pelvic: Cervical exam deferred        Extremities: Normal range of motion.  Edema: None  Mental Status: Normal mood and affect. Normal behavior. Normal judgment and thought content.   Assessment and Plan:  Pregnancy: G3P2002 at [redacted]w[redacted]d 1. Supervision of high risk pregnancy, antepartum (Primary) Offered flu shot - pt accepts but left before we gave it to her - I could not find her in the waiting room or outside. Will give next visit.  GBS neg Pt will send me mychart msg of receipt for BTS. Have also sent msg to Jacinda to clarify.   2. Pregnancy with 38 completed weeks gestation  3. Fetal growth restriction antepartum Resolved, now  21%ile, no additional US  indicated.   4. Language barrier Spanish interpreter used throughout.   Term labor symptoms and general obstetric precautions including but not limited to vaginal bleeding, contractions, leaking of fluid and fetal movement were reviewed in detail with the patient. Please refer to After Visit Summary for other counseling recommendations.   Return in about 1 week (around 05/05/2024) for OB VISIT, MD or APP.  Future Appointments  Date Time Provider Department Center  05/03/2024 10:55 AM Nicholaus Burnard HERO, MD [facility]  05/10/2024  3:15 PM Izell Harari, MD [facility]    Vina Solian, MD

## 2024-05-02 ENCOUNTER — Encounter: Payer: Self-pay | Admitting: Obstetrics and Gynecology

## 2024-05-02 DIAGNOSIS — Z3009 Encounter for other general counseling and advice on contraception: Secondary | ICD-10-CM | POA: Insufficient documentation

## 2024-05-03 ENCOUNTER — Ambulatory Visit (INDEPENDENT_AMBULATORY_CARE_PROVIDER_SITE_OTHER): Payer: Self-pay | Admitting: Obstetrics and Gynecology

## 2024-05-03 ENCOUNTER — Encounter: Payer: Self-pay | Admitting: Obstetrics and Gynecology

## 2024-05-03 ENCOUNTER — Other Ambulatory Visit: Payer: Self-pay

## 2024-05-03 VITALS — BP 102/66 | HR 76 | Wt 178.8 lb

## 2024-05-03 DIAGNOSIS — O365990 Maternal care for other known or suspected poor fetal growth, unspecified trimester, not applicable or unspecified: Secondary | ICD-10-CM

## 2024-05-03 DIAGNOSIS — O0990 Supervision of high risk pregnancy, unspecified, unspecified trimester: Secondary | ICD-10-CM

## 2024-05-03 DIAGNOSIS — Z603 Acculturation difficulty: Secondary | ICD-10-CM

## 2024-05-03 DIAGNOSIS — Z758 Other problems related to medical facilities and other health care: Secondary | ICD-10-CM

## 2024-05-03 DIAGNOSIS — L80 Vitiligo: Secondary | ICD-10-CM

## 2024-05-03 DIAGNOSIS — Z3009 Encounter for other general counseling and advice on contraception: Secondary | ICD-10-CM

## 2024-05-03 DIAGNOSIS — Z3A38 38 weeks gestation of pregnancy: Secondary | ICD-10-CM

## 2024-05-03 DIAGNOSIS — O0993 Supervision of high risk pregnancy, unspecified, third trimester: Secondary | ICD-10-CM

## 2024-05-03 DIAGNOSIS — O09523 Supervision of elderly multigravida, third trimester: Secondary | ICD-10-CM

## 2024-05-03 NOTE — Progress Notes (Signed)
   PRENATAL VISIT NOTE  Subjective:  Betty [name] is a 33 y.o. G3P2002 at [redacted]w[redacted]d being seen today for ongoing prenatal care.  She is currently monitored for the following issues for this high-risk pregnancy and has Vitiligo; AMA (advanced maternal age) multigravida 35+; Fetal growth restriction antepartum; Supervision of high risk pregnancy, antepartum; Language barrier; Abnormal glucose tolerance test; and Unwanted fertility on their problem list.  Patient reports no complaints.  Contractions: Irritability. Vag. Bleeding: None.  Movement: Present. Denies leaking of fluid.   The following portions of the patient's history were reviewed and updated as appropriate: allergies, current medications, past family history, past medical history, past social history, past surgical history and problem list.   Objective:    Vitals:   05/03/24 1131  BP: 102/66  Pulse: 76  Weight: 178 lb 12.8 oz (81.1 kg)    Fetal Status:  Fetal Heart Rate (bpm): 151   Movement: Present    General: Alert, oriented and cooperative. Patient is in no acute distress.  Skin: Skin is warm and dry. No rash noted.   Cardiovascular: Normal heart rate noted  Respiratory: Normal respiratory effort, no problems with respiration noted  Abdomen: Soft, gravid, appropriate for gestational age.  Pain/Pressure: Absent     Pelvic: Cervical exam performed in the presence of a chaperone     Station: Ballotable  Extremities: Normal range of motion.  Edema: None  Mental Status: Normal mood and affect. Normal behavior. Normal judgment and thought content.   Assessment and Plan:  Pregnancy: G3P2002 at 103w5d  1. Vitiligo (Primary)  2. Multigravida of advanced maternal age in third trimester  3. Supervision of high risk pregnancy, antepartum Office out of flu shots--cannot get today Will plan for induction at 40 weeks given prior IUGR (now resolved)  4. Fetal growth restriction antepartum Resolved, last growth 21%tile  5.  Language barrier Speaks English  6. Unwanted fertility Has prepaid--receipt in chart  7. [redacted] weeks gestation of pregnancy   Term labor symptoms and general obstetric precautions including but not limited to vaginal bleeding, contractions, leaking of fluid and fetal movement were reviewed in detail with the patient. Please refer to After Visit Summary for other counseling recommendations.   Return in about 1 week (around 05/10/2024) for high OB.  Future Appointments  Date Time Provider Department Center  05/10/2024  3:15 PM Izell Harari, MD [facility]    Burnard CHRISTELLA Moats, MD

## 2024-05-04 ENCOUNTER — Inpatient Hospital Stay (HOSPITAL_COMMUNITY)
Admission: AD | Admit: 2024-05-04 | Discharge: 2024-05-05 | DRG: 807 | Disposition: A | Discharge status: Home / Self Care | Payer: MEDICAID | Attending: Family Medicine | Admitting: Family Medicine

## 2024-05-04 ENCOUNTER — Other Ambulatory Visit: Payer: Self-pay

## 2024-05-04 ENCOUNTER — Encounter (HOSPITAL_COMMUNITY): Payer: Self-pay | Admitting: Obstetrics & Gynecology

## 2024-05-04 DIAGNOSIS — Z3A38 38 weeks gestation of pregnancy: Secondary | ICD-10-CM

## 2024-05-04 DIAGNOSIS — O0990 Supervision of high risk pregnancy, unspecified, unspecified trimester: Principal | ICD-10-CM

## 2024-05-04 DIAGNOSIS — Z603 Acculturation difficulty: Secondary | ICD-10-CM

## 2024-05-04 DIAGNOSIS — Z23 Encounter for immunization: Secondary | ICD-10-CM

## 2024-05-04 DIAGNOSIS — O09523 Supervision of elderly multigravida, third trimester: Secondary | ICD-10-CM | POA: Diagnosis not present

## 2024-05-04 DIAGNOSIS — Z758 Other problems related to medical facilities and other health care: Secondary | ICD-10-CM

## 2024-05-04 DIAGNOSIS — O26893 Other specified pregnancy related conditions, third trimester: Secondary | ICD-10-CM | POA: Diagnosis present

## 2024-05-04 LAB — CBC
HCT: 42.1 % (ref 36.0–46.0)
Hemoglobin: 13.6 g/dL (ref 12.0–15.0)
MCH: 28.5 pg (ref 26.0–34.0)
MCHC: 32.3 g/dL (ref 30.0–36.0)
MCV: 88.3 fL (ref 80.0–100.0)
Platelets: 233 K/uL (ref 150–400)
RBC: 4.77 MIL/uL (ref 3.87–5.11)
RDW: 14.4 % (ref 11.5–15.5)
WBC: 10.1 K/uL (ref 4.0–10.5)
nRBC: 0 % (ref 0.0–0.2)

## 2024-05-04 LAB — RPR: RPR Ser Ql: NONREACTIVE

## 2024-05-04 LAB — TYPE AND SCREEN
ABO/RH(D): O POS
Antibody Screen: NEGATIVE

## 2024-05-04 MED ORDER — MAGNESIUM HYDROXIDE 400 MG/5ML PO SUSP: 30.0000 mL | ORAL | Status: DC | PRN

## 2024-05-04 MED ORDER — METHYLERGONOVINE MALEATE 0.2 MG/ML IJ SOLN: 0.2000 mg | Freq: Once | INTRAMUSCULAR | Status: DC

## 2024-05-04 MED ORDER — PRENATAL MULTIVITAMIN CH
1.0000 | ORAL_TABLET | Freq: Every day | ORAL | Status: DC
  Administered 2024-05-04 – 2024-05-05 (×2): 1 via ORAL
  Filled 2024-05-04 (×2): qty 1

## 2024-05-04 MED ORDER — PHENYLEPHRINE 80 MCG/ML (10ML) SYRINGE FOR IV PUSH (FOR BLOOD PRESSURE SUPPORT): 80.0000 ug | PREFILLED_SYRINGE | INTRAVENOUS | Status: DC | PRN

## 2024-05-04 MED ORDER — FENTANYL CITRATE (PF) 100 MCG/2ML IJ SOLN
50.0000 ug | INTRAMUSCULAR | Status: DC | PRN
  Administered 2024-05-04: 100 ug via INTRAVENOUS
  Filled 2024-05-04: qty 2

## 2024-05-04 MED ORDER — OXYTOCIN-SODIUM CHLORIDE 30-0.9 UT/500ML-% IV SOLN
2.5000 [IU]/h | INTRAVENOUS | Status: DC
  Filled 2024-05-04: qty 500

## 2024-05-04 MED ORDER — WITCH HAZEL-GLYCERIN EX PADS: 1.0000 | MEDICATED_PAD | CUTANEOUS | Status: DC | PRN

## 2024-05-04 MED ORDER — OXYTOCIN BOLUS FROM INFUSION
333.0000 mL | Freq: Once | INTRAVENOUS | Status: DC
Start: 2024-05-04 — End: 2024-05-04

## 2024-05-04 MED ORDER — FENTANYL-BUPIVACAINE-NACL 0.5-0.125-0.9 MG/250ML-% EP SOLN
EPIDURAL | Status: AC
  Filled 2024-05-04: qty 250

## 2024-05-04 MED ORDER — DIPHENHYDRAMINE HCL 25 MG PO CAPS: 25.0000 mg | ORAL_CAPSULE | Freq: Four times a day (QID) | ORAL | Status: DC | PRN

## 2024-05-04 MED ORDER — OXYTOCIN 10 UNIT/ML IJ SOLN: 10.0000 [IU] | Freq: Once | INTRAMUSCULAR | Status: DC

## 2024-05-04 MED ORDER — OXYCODONE-ACETAMINOPHEN 5-325 MG PO TABS: 2.0000 | ORAL_TABLET | ORAL | Status: DC | PRN

## 2024-05-04 MED ORDER — ONDANSETRON HCL 4 MG/2ML IJ SOLN: 4.0000 mg | Freq: Four times a day (QID) | INTRAMUSCULAR | Status: DC | PRN

## 2024-05-04 MED ORDER — ACETAMINOPHEN 325 MG PO TABS: 650.0000 mg | ORAL_TABLET | ORAL | Status: DC | PRN

## 2024-05-04 MED ORDER — OXYTOCIN 10 UNIT/ML IJ SOLN
INTRAMUSCULAR | Status: AC
  Administered 2024-05-04: 10 [IU]
  Filled 2024-05-04: qty 1

## 2024-05-04 MED ORDER — LACTATED RINGERS IV SOLN: 500.0000 mL | INTRAVENOUS | Status: DC | PRN

## 2024-05-04 MED ORDER — SOD CITRATE-CITRIC ACID 500-334 MG/5ML PO SOLN: 30.0000 mL | ORAL | Status: DC | PRN

## 2024-05-04 MED ORDER — MEASLES, MUMPS & RUBELLA VAC ~~LOC~~ SUSR: 0.5000 mL | Freq: Once | SUBCUTANEOUS | Status: DC

## 2024-05-04 MED ORDER — DIBUCAINE (PERIANAL) 1 % EX OINT: 1.0000 | TOPICAL_OINTMENT | CUTANEOUS | Status: DC | PRN

## 2024-05-04 MED ORDER — EPHEDRINE 5 MG/ML INJ: 10.0000 mg | INTRAVENOUS | Status: DC | PRN

## 2024-05-04 MED ORDER — DIPHENHYDRAMINE HCL 50 MG/ML IJ SOLN: 12.5000 mg | INTRAMUSCULAR | Status: DC | PRN

## 2024-05-04 MED ORDER — ONDANSETRON HCL 4 MG PO TABS: 4.0000 mg | ORAL_TABLET | ORAL | Status: DC | PRN

## 2024-05-04 MED ORDER — BENZOCAINE-MENTHOL 20-0.5 % EX AERO: 1.0000 | INHALATION_SPRAY | CUTANEOUS | Status: DC | PRN

## 2024-05-04 MED ORDER — COCONUT OIL OIL: 1.0000 | TOPICAL_OIL | Status: DC | PRN

## 2024-05-04 MED ORDER — IBUPROFEN 600 MG PO TABS
600.0000 mg | ORAL_TABLET | Freq: Four times a day (QID) | ORAL | Status: DC
  Administered 2024-05-04 – 2024-05-05 (×4): 600 mg via ORAL
  Filled 2024-05-04 (×5): qty 1

## 2024-05-04 MED ORDER — LIDOCAINE HCL (PF) 1 % IJ SOLN: 30.0000 mL | INTRAMUSCULAR | Status: DC | PRN

## 2024-05-04 MED ORDER — TETANUS-DIPHTH-ACELL PERTUSSIS 5-2-15.5 LF-MCG/0.5 IM SUSP: 0.5000 mL | Freq: Once | INTRAMUSCULAR | Status: DC

## 2024-05-04 MED ORDER — OXYCODONE-ACETAMINOPHEN 5-325 MG PO TABS: 1.0000 | ORAL_TABLET | ORAL | Status: DC | PRN

## 2024-05-04 MED ORDER — OXYCODONE HCL 5 MG PO TABS
10.0000 mg | ORAL_TABLET | ORAL | Status: DC | PRN
  Administered 2024-05-05: 10 mg via ORAL
  Filled 2024-05-04: qty 2

## 2024-05-04 MED ORDER — OXYCODONE HCL 5 MG PO TABS
5.0000 mg | ORAL_TABLET | ORAL | Status: DC | PRN
  Administered 2024-05-04: 5 mg via ORAL
  Filled 2024-05-04: qty 1

## 2024-05-04 MED ORDER — ACETAMINOPHEN 325 MG PO TABS
650.0000 mg | ORAL_TABLET | ORAL | Status: DC | PRN
  Administered 2024-05-04: 650 mg via ORAL
  Filled 2024-05-04: qty 2

## 2024-05-04 MED ORDER — FENTANYL-BUPIVACAINE-NACL 0.5-0.125-0.9 MG/250ML-% EP SOLN: 12.0000 mL/h | EPIDURAL | Status: DC | PRN

## 2024-05-04 MED ORDER — ONDANSETRON HCL 4 MG/2ML IJ SOLN: 4.0000 mg | INTRAMUSCULAR | Status: DC | PRN

## 2024-05-04 MED ORDER — LACTATED RINGERS IV SOLN
500.0000 mL | Freq: Once | INTRAVENOUS | Status: DC
Start: 2024-05-04 — End: 2024-05-04

## 2024-05-04 MED ORDER — LACTATED RINGERS IV SOLN: INTRAVENOUS | Status: DC

## 2024-05-04 MED ORDER — SIMETHICONE 80 MG PO CHEW: 80.0000 mg | CHEWABLE_TABLET | ORAL | Status: DC | PRN

## 2024-05-04 NOTE — H&P (Signed)
 Betty [name] is a 33 y.o. female presenting for contractions. Changed from 4 to 6.5 cm in MAU. Denies ROM ro bleeding. Nml fetal mvmt. Started care at [facility] and transferred to [facility] for FGR which has since resolved.   Plans BTL. Paid [phone] surgeon fee but not anesthesia fee or facility fee.   NURSING  PROVIDER  Conservator, museum/gallery for Women Dating by US   [facility] Model Traditional Anatomy U/S FGR > Resolved.   Initiated care at  [employer]  Spanish              LAB RESULTS   Support Person Nahum FOB Genetics NIPS: Not done AFP: Not done - too late    NT/IT (FT only)     Carrier Screen Horizon: Not done  Rhogam  O/Positive/-- (03/28 0000) A1C/GTT Early HgbA1C:  Third trimester 2 hr GTT: 3 hr wnl  Flu Vaccine     TDaP Vaccine Received at [facility]  Blood Type O/Positive/-- (03/28 0000)  RSV Vaccine  Antibody Negative (03/24 0000)  COVID Vaccine  Rubella Immune (03/24 0000)  Feeding Plan both RPR NON REACTIVE (02/28 1716)  Contraception bilateral tubal ligation or IUD Mirena  HBsAg Negative (03/24 0000)  Circumcision Girl HIV NON REACTIVE (02/28 1716)  Pediatrician  [place] [facility] HCVAb Negative (03/24 0000)  Prenatal Classes     BTL Consent  Pap Diagnosis  Date Value Ref Range Status  05/08/2016   Final   NEGATIVE FOR INTRAEPITHELIAL LESIONS OR MALIGNANCY.    BTL Pre-payment  GC/CT Initial:   36wks:    VBAC Consent  GBS Negative/-- (09/18 1007)neg For PCN allergy, check sensitivities   BRx Optimized? [ ]  yes   [ ]  no    DME Rx [ ]  BP cuff [ ]  Weight Scale Waterbirth  [ ]  Class [ ]  Consent [ ]  CNM visit  PHQ9 & GAD7 [  ] new OB [  ] 28 weeks  [  ] 36 weeks Induction  [ ]  Orders Entered [ ] Foley Y/N       OB History     Gravida  3   Para  2   Term  2   Preterm  0   AB  0   Living  2      SAB  0   IAB  0   Ectopic  0   Multiple  0   Live Births  2          Past Medical History:  Diagnosis Date   UTI (urinary tract  infection)    Vitiligo 04/25/2021   History reviewed. No pertinent surgical history. Family History: family history is not on file. Social History:  reports that she has never smoked. She has never used smokeless tobacco. She reports that she does not drink alcohol and does not use drugs.     Maternal Diabetes: No Genetic Screening: Declined Maternal Ultrasounds/Referrals: IUGR> resolved Fetal Ultrasounds or other Referrals:  Referred to Materal Fetal Medicine  Maternal Substance Abuse:  No Significant Maternal Medications:  None Significant Maternal Lab Results:  Group B Strep negative Number of Prenatal Visits:greater than 3 verified prenatal visits Maternal Vaccinations:TDap Other Comments:  None  Review of Systems  Constitutional:  Negative for chills and fever.  Eyes:  Negative for visual disturbance.  Gastrointestinal:  Positive for abdominal pain. Negative for nausea and vomiting.  Genitourinary:  Negative for vaginal bleeding and vaginal discharge.  Neurological:  Negative for headaches.   Maternal Medical History:  Reason for admission: Nausea.    Dilation: 9 Effacement (%): 80 Station: -1 Exam by:: Deel RN Blood pressure 112/64, pulse 73, last menstrual period 07/29/2023. Maternal Exam:  Uterine Assessment: Contraction strength is firm.  Contraction frequency is regular.  Abdomen: Patient reports no abdominal tenderness. Estimated fetal weight is 6.5 lb.   Fetal presentation: vertex Introitus: Normal vulva. Vagina is negative for discharge.  Pelvis: adequate for delivery.   Cervix: Cervix evaluated by digital exam.     Fetal Exam Fetal Monitor Review: Baseline rate: 140.  Variability: moderate (6-25 bpm).   Pattern: accelerations present and no decelerations.   Fetal State Assessment: Category I - tracings are normal.   Physical Exam Constitutional:      General: She is in acute distress.     Appearance: She is well-developed. She is not ill-appearing  or toxic-appearing.  HENT:     Head: Normocephalic.  Eyes:     Conjunctiva/sclera: Conjunctivae normal.  Cardiovascular:     Rate and Rhythm: Normal rate and regular rhythm.     Heart sounds: Normal heart sounds.  Pulmonary:     Effort: Pulmonary effort is normal. No respiratory distress.     Breath sounds: Normal breath sounds.  Abdominal:     Palpations: Abdomen is soft.     Tenderness: There is no abdominal tenderness.  Genitourinary:    General: Normal vulva.     Vagina: No vaginal discharge or bleeding.  Musculoskeletal:        General: Normal range of motion.     Cervical back: Normal range of motion and neck supple.     Right lower leg: No edema.     Left lower leg: No edema.  Skin:    General: Skin is warm and dry.  Neurological:     Mental Status: She is alert and oriented to person, place, and time.  Psychiatric:        Mood and Affect: Mood normal.     Prenatal labs: ABO, Rh: --/--/O POS (10/08 0900) Antibody: NEG (10/08 0900) Rubella: Immune (03/24 0000) RPR: NON REACTIVE (02/28 1716)  HBsAg: Negative (03/24 0000)  HIV: NON REACTIVE (02/28 1716)  GBS: Negative/-- (09/18 1007)   US  04/24/24: Est. FW: 2522 gm 5 lb 9 oz 21 %   Assessment: 1. Labor: transition 2. Fetal Wellbeing: Category I  3. Pain Control: Requests epidural. May be too late. Awaiting Ptls 4. GBS: neg 5. 38.6 week IUP 6. FGR->resolved 7. Plans BTL  Plan:  1. Admit to BS per consult with MD 2. Routine L&D orders 3. Analgesia/anesthesia PRN  4. Will discuss additional costs of postpartum BTL when pt not in pain.   Khalani Novoa 05/04/2024, 11:12 AM

## 2024-05-04 NOTE — Lactation Note (Signed)
 This note was copied from a baby's chart. Lactation Consultation Note  Patient Name: Betty [name] Unijb'd Date: 05/04/2024 Age:35 hours Reason for consult: Initial assessment;Early term 37-38.6wks Video interpreter Raynell [phone]) used to communicate with MOB in Spanish.  P3- MOB reports that infant has been feeding well. Infant had recently fed for 5 minutes, but was rooting around. During LC's consult, MOB latched infant to the left breast in the cradle hold. Infant was eager and latched with no assistance from [facility]. MOB denied having any questions or concerns. LC reviewed the first 24 hr birthday nap, day 2 cluster feeding, feeding infant on cue 8-12x in 24 hrs, not allowing infant to go over 3 hrs without a feeding, CDC milk storage guidelines, LC services handout and engorgement/breast care. LC encouraged MOB to call for further assistance as needed. MOB declined further follow ups.  Maternal Data Has patient been taught Hand Expression?: No Does the patient have breastfeeding experience prior to this delivery?: Yes How long did the patient breastfeed?: 4 years with first child and 1 year with second child  Feeding Mother's Current Feeding Choice: Breast Milk  LATCH Score Latch: Grasps breast easily, tongue down, lips flanged, rhythmical sucking.  Audible Swallowing: A few with stimulation  Type of Nipple: Everted at rest and after stimulation  Comfort (Breast/Nipple): Soft / non-tender  Hold (Positioning): No assistance needed to correctly position infant at breast.  LATCH Score: 9   Lactation Tools Discussed/Used Tools: Pump;Flanges Breast pump type: Manual Pump Education: Setup, frequency, and cleaning;Milk Storage Reason for Pumping: MOB has no pump Pumping frequency: 15-20 min every 3 hrs  Interventions Interventions: Breast feeding basics reviewed;Hand pump;Education;LC Services brochure  Discharge Discharge Education: Engorgement and breast care;Warning  signs for feeding baby Pump: Manual  Consult Status Consult Status: Complete (mother declined follow up)    Recardo Hoit BS, IBCLC 05/04/2024, 8:56 PM

## 2024-05-04 NOTE — MAU Note (Signed)
.  Betty [name] is a 33 y.o. at [redacted]w[redacted]d here in MAU reporting: CTX that started @ 0100 and are 3-4 minutes apart. Unsure about plan for pain management. Denies LO and VB, states she just has to go to bathroom often. Reports +FM.   LMP: na Onset of complaint: 0100 Pain score: 5/10 There were no vitals filed for this visit.   FHT: 138  Lab orders placed from triage: labor eval

## 2024-05-04 NOTE — Progress Notes (Signed)
 Went to bedside to speak with patient and her partner regarding cost of BTL. Patient has prepaid ~$1600 for a tubal with understanding this was majority of cost. She had not been aware that there were additional phone numbers to call and get cost information. I called those earlier today and discovered that the anesthesia costs would be ~$1800 and facility costs would be roughly ~$8500, so around $12k total. Patient did not want to proceed at this cost level. She is worried her deposit is non refundable, I promised to try and get her a refund. I also reviewed alternative methods such as IUD, nexplanon, Depo, and vasectomy. She is undecided and will think about it.

## 2024-05-05 ENCOUNTER — Other Ambulatory Visit (HOSPITAL_COMMUNITY): Payer: Self-pay

## 2024-05-05 LAB — BIRTH TISSUE RECOVERY COLLECTION (PLACENTA DONATION)

## 2024-05-05 LAB — CBC
HCT: 35.9 % — ABNORMAL LOW (ref 36.0–46.0)
Hemoglobin: 11.7 g/dL — ABNORMAL LOW (ref 12.0–15.0)
MCH: 28.7 pg (ref 26.0–34.0)
MCHC: 32.6 g/dL (ref 30.0–36.0)
MCV: 88.2 fL (ref 80.0–100.0)
Platelets: 207 K/uL (ref 150–400)
RBC: 4.07 MIL/uL (ref 3.87–5.11)
RDW: 14.5 % (ref 11.5–15.5)
WBC: 11.4 K/uL — ABNORMAL HIGH (ref 4.0–10.5)
nRBC: 0 % (ref 0.0–0.2)

## 2024-05-05 MED ORDER — INFLUENZA VIRUS VACC SPLIT PF (FLUZONE) 0.5 ML IM SUSY
0.5000 mL | PREFILLED_SYRINGE | Freq: Once | INTRAMUSCULAR | Status: AC
  Administered 2024-05-05: 0.5 mL via INTRAMUSCULAR
  Filled 2024-05-05: qty 0.5

## 2024-05-05 MED ORDER — IBUPROFEN 600 MG PO TABS
600.0000 mg | ORAL_TABLET | Freq: Four times a day (QID) | ORAL | 0 refills | Status: AC
  Filled 2024-05-05: qty 30, 8d supply, fill #0

## 2024-05-05 MED ORDER — ACETAMINOPHEN 500 MG PO TABS
1000.0000 mg | ORAL_TABLET | Freq: Four times a day (QID) | ORAL | 0 refills | Status: AC | PRN
  Filled 2024-05-05: qty 60, 8d supply, fill #0

## 2024-05-05 NOTE — Discharge Summary (Signed)
 Postpartum Discharge Summary     Patient Name: Betty [name] DOB: Feb 24, 1991 MRN: [id number]  Date of admission: 05/04/2024 Delivery date:05/04/2024 Delivering provider: CLAUDENE, [place]  Date of discharge: 05/05/2024  Admitting diagnosis: Normal labor [O80, Z37.9] Intrauterine pregnancy: [redacted]w[redacted]d     Secondary diagnosis:  Active Problems:   Normal labor   Vaginal delivery  Additional problems: AMA, language barrier     Discharge diagnosis: Term Pregnancy Delivered                                              Post partum procedures:none Augmentation: AROM Complications: None  Hospital course: Onset of Labor With Vaginal Delivery      33 y.o. yo G3P3003 at [redacted]w[redacted]d was admitted in Active Labor on 05/04/2024. Labor course was uncomplicated. Membrane Rupture Time/Date: 11:34 AM,05/04/2024  Delivery Method:Vaginal, Spontaneous Operative Delivery:N/A Episiotomy: None Lacerations:  None Patient had an uncomplicated.  She is ambulating, tolerating a regular diet, passing flatus, and urinating well. Patient is discharged home in stable condition on 05/05/24.  Newborn Data: Birth date:05/04/2024 Birth time:11:55 AM Gender:Female Living status:Living Apgars:8 ,9  Weight:2650 g  Magnesium  Sulfate received: No BMZ received: No Rhophylac:No MMR:N/A T-DaP:Given prenatally Flu: offered prior to d/c RSV Vaccine received: No Transfusion:No  Immunizations received: Immunization History  Administered Date(s) Administered   Influenza Split 05/08/2012   Influenza,inj,Quad PF,6+ Mos 10/19/2023   Tdap 05/08/2012, 12/17/2015, 02/23/2024    Physical exam  Vitals:   05/04/24 1412 05/04/24 1823 05/04/24 2231 05/05/24 0230  BP: (!) 107/59 107/71 106/65 96/71  Pulse: 60 60 63 61  Resp: 16 16 17 18   Temp: 98 F (36.7 C) 98.3 F (36.8 C) 98.3 F (36.8 C) 97.8 F (36.6 C)  TempSrc: Oral Oral Oral Oral  SpO2:   99% 100%   General: alert, cooperative, and no distress Lochia:  appropriate Uterine Fundus: firm Incision: N/A DVT Evaluation: No significant calf/ankle edema. Labs: Lab Results  Component Value Date   WBC 11.4 (H) 05/05/2024   HGB 11.7 (L) 05/05/2024   HCT 35.9 (L) 05/05/2024   MCV 88.2 05/05/2024   PLT 207 05/05/2024      Latest Ref Rng & Units 04/25/2021    5:16 PM  CMP  Glucose 70 - 99 mg/dL 896   BUN 6 - 20 mg/dL 12   Creatinine 9.42 - 1.00 mg/dL 9.33   Sodium 865 - 855 mmol/L 138   Potassium 3.5 - 5.2 mmol/L 4.0   Chloride 96 - 106 mmol/L 102   Calcium 8.7 - 10.2 mg/dL 9.5   Total Protein 6.0 - 8.5 g/dL 7.2   Total Bilirubin 0.0 - 1.2 mg/dL 0.5   Alkaline Phos 44 - 121 IU/L 74   AST 0 - 40 IU/L 32    Edinburgh Score:     No data to display         No data recorded  After visit meds:  Allergies as of 05/05/2024   No Known Allergies      Medication List     STOP taking these medications    aspirin  EC 81 MG tablet   Prenatal 28-0.8 MG Tabs       TAKE these medications    acetaminophen  500 MG tablet Commonly known as: TYLENOL  Take 2 tablets (1,000 mg total) by mouth every 6 (six) hours as needed for mild pain (  pain score 1-3) or moderate pain (pain score 4-6). What changed: See the new instructions.   ibuprofen  600 MG tablet Commonly known as: ADVIL  Take 1 tablet (600 mg total) by mouth every 6 (six) hours.   prenatal multivitamin Tabs tablet Take 1 tablet by mouth daily at 12 noon.         Discharge home in stable condition Infant Feeding: Bottle and Breast Infant Disposition:home with mother Discharge instruction: per After Visit Summary and Postpartum booklet. Activity: Advance as tolerated. Pelvic rest for 6 weeks.  Diet: routine diet Future Appointments: Future Appointments  Date Time Provider Department Center  05/10/2024  3:15 PM Izell Harari, MD [facility]   Follow up Visit: Message sent to [facility] 10/9  Please schedule this patient for a In person postpartum visit in 4-6 weeks with  the following provider: Any provider. Additional Postpartum F/U:none  Low risk pregnancy complicated by: none Delivery mode:  Vaginal, Spontaneous Anticipated Birth Control: outpatient hormonal IUD   05/05/2024 Leeroy KATHEE Pouch, MD

## 2024-05-05 NOTE — Progress Notes (Addendum)
 Post Partum Day 1 Subjective: up ad lib, voiding, and tolerating PO No BM or flatus. Cramping in abdomen.  Objective: Blood pressure 96/71, pulse 61, temperature 97.8 F (36.6 C), temperature source Oral, resp. rate 18, last menstrual period 07/29/2023, SpO2 100%, unknown if currently breastfeeding.  Physical Exam:  General: alert, cooperative, appears stated age, and no distress Lochia: appropriate Uterine Fundus: firm Incision: n/a LE edema: No significant calf/ankle edema.  Recent Labs    05/04/24 0900 05/05/24 0408  HGB 13.6 11.7*  HCT 42.1 35.9*    Assessment/Plan: Discharge home, Breastfeeding, and Contraception - hormonal IUD   LOS: 1 day   Lauraine Clause, Student-PA 05/05/2024, 7:48 AM   GME ATTESTATION:  Evaluation and management procedures were performed by the PA student under my supervision. I was immediately available for direct supervision, assistance and direction throughout this encounter.  I also confirm that I have verified the information documented in the PA student's note, and that I have also personally reperformed the pertinent components of the physical exam and all of the medical decision making activities.  I have also made any necessary editorial changes.  Leeroy KATHEE Pouch, MD OB Fellow, Faculty Practice [facility], Center for [facility] 05/05/2024 8:47 AM

## 2024-05-05 NOTE — Patient Instructions (Addendum)
 Su cita con el Departamento de [facility] Ambulatoria es: Fecha: 29/04/2024 Hora: 11:30 a. m. Therapist, music for Women Best boy Piso) [address] [place], [place]  Regstrese con el nombre del beb.  Por favor, traiga a su beb con hambre, junto con su extractor y un bibern de frmula o [place] materna extrada. Traiga tambin las bridas de su extractor. Agradecemos a quienes nos apoyan! Si necesita ayuda con la lactancia antes de su cita, llame al [phone] para comunicarse con el buzn de voz de lactancia.  Your appointment with Outpatient Lactation is: Date: 05/25/2024 Time: 11:30 am [facility] (First Floor) [address] [place]  Check in under baby's name.  Please bring your baby hungry along with your pump and a bottle of either formula or expressed breast milk. Please also bring your pump flanges and we welcome support people! If you need lactation assistance before your appointment, please call [phone] for lactation voice mail.  Lactation support groups:  [facility], Tuesdays 10:00 am -12:00 pm at [address] on the second floor in the conference room, lactating parents and lap babies welcome.  [email or web address]  [email or web address]  Si est interesado en una consulta ambulatoria sobre lactancia en el consultorio o virtualmente, comunquese con nosotros al [facility] quality scientist) [address] [place], [place] No dude en comunicarse con cualquier pregunta o inquietud relacionada con la lactancia al [phone] para dejar un mensaje en nuestro buzn de voz de lactancia  Grupos de apoyo para la lactancia:  [facility]omedical [facility] Women, martes de 10:00 a. m. a 12:00 p. m., en [address], segundo piso, sala de conferencias. Se admiten madres lactantes y bebs en regazo.      Delaney Mandril, [facility] for [facility]

## 2024-05-10 ENCOUNTER — Encounter: Admitting: Obstetrics and Gynecology

## 2024-05-12 ENCOUNTER — Inpatient Hospital Stay (HOSPITAL_COMMUNITY): Admission: RE | Admit: 2024-05-12 | Payer: Self-pay | Source: Home / Self Care | Admitting: Obstetrics and Gynecology

## 2024-05-12 ENCOUNTER — Inpatient Hospital Stay (HOSPITAL_COMMUNITY): Payer: Self-pay

## 2024-05-12 ENCOUNTER — Telehealth (HOSPITAL_COMMUNITY): Payer: Self-pay

## 2024-05-12 NOTE — Telephone Encounter (Signed)
 05/12/2024 1901  Name: Betty [name] MRN: [id number] DOB: 1990/08/16  Reason for Call:  Transition of Care Hospital Discharge Call  Contact Status: Patient Contact Status: Message  Language assistant needed: Interpreter Mode: Telephonic Interpreter Interpreter Name: [phone] Interpreter Phone Number - If applicable: [phone]        Follow-Up Questions:    Van Postnatal Depression Scale:  In the Past 7 Days:    PHQ2-9 Depression Scale:     Discharge Follow-up:    Post-discharge interventions: NA  Signature  Rosaline Deretha PEAK

## 2024-06-13 ENCOUNTER — Other Ambulatory Visit: Payer: Self-pay

## 2024-06-13 ENCOUNTER — Ambulatory Visit (INDEPENDENT_AMBULATORY_CARE_PROVIDER_SITE_OTHER): Payer: Self-pay | Admitting: Obstetrics and Gynecology

## 2024-06-13 ENCOUNTER — Encounter: Payer: Self-pay | Admitting: Obstetrics and Gynecology

## 2024-06-13 NOTE — Progress Notes (Signed)
 Post Partum Visit Note  Betty [name] is a 33 y.o. G42P3003 female who presents for a postpartum visit. She is 5 weeks postpartum following a normal spontaneous vaginal delivery.  I have fully reviewed the prenatal and intrapartum course. The delivery was at 38.6 gestational weeks.  Anesthesia: none. Postpartum course has been well. Baby is doing well. Baby is feeding by breast. Bleeding no bleeding. Bowel function is normal. Bladder function is normal. Patient is not sexually active. Contraception method is IUD. Postpartum depression screening: negative.   Upstream - 06/13/24 1013       Pregnancy Intention Screening   Does the patient want to become pregnant in the next year? No    Does the patient's partner want to become pregnant in the next year? No    Would the patient like to discuss contraceptive options today? Yes         The pregnancy intention screening data noted above was reviewed. Potential methods of contraception were discussed. The patient elected to proceed with No data recorded.   Edinburgh Postnatal Depression Scale - 06/13/24 1012       Edinburgh Postnatal Depression Scale:  In the Past 7 Days   I have been able to laugh and see the funny side of things. 0    I have looked forward with enjoyment to things. 0    I have blamed myself unnecessarily when things went wrong. 0    I have been anxious or worried for no good reason. 0    I have felt scared or panicky for no good reason. 0    Things have been getting on top of me. 0    I have been so unhappy that I have had difficulty sleeping. 0    I have felt sad or miserable. 0    I have been so unhappy that I have been crying. 0    The thought of harming myself has occurred to me. 0    Edinburgh Postnatal Depression Scale Total 0          Health Maintenance Due  Topic Date Due   Hepatitis B Vaccines 19-59 Average Risk (1 of 3 - 19+ 3-dose series) Never done   HPV VACCINES (1 - 3-dose SCDM series)  Never done   Cervical Cancer Screening (HPV/Pap Cotest)  05/09/2019   COVID-19 Vaccine (1 - 2025-26 season) Never done    The following portions of the patient's history were reviewed and updated as appropriate: allergies, current medications, past family history, past medical history, past social history, past surgical history, and problem list.  Review of Systems Pertinent items are noted in HPI.  Objective:  BP 121/82   Pulse 70   Wt 167 lb 3.2 oz (75.8 kg)   LMP 07/29/2023   Breastfeeding Yes   BMI 29.15 kg/m    General:  alert, cooperative, and no distress  Lungs: Normal effort  Heart:  Regular rate       Assessment:   6 wk postpartum exam.  - Msg sent to [facility] re: refunding self pay tubal amount.   Plan:   Essential components of care per ACOG recommendations:  1.  Mood and well being: Patient with negative depression screening today. Reviewed local resources for support.  - Patient tobacco use? No.   - hx of drug use? No.    2. Infant care and feeding:  -Patient currently breastmilk feeding? Yes. Reviewed importance of draining breast regularly to support lactation.  -Social  determinants of health (SDOH) reviewed in EPIC. No concerns  3. Sexuality, contraception and birth spacing - Patient does not want a pregnancy in the next year.  Desired family size is 3 children.  - Reviewed reproductive life planning. Reviewed contraceptive methods based on pt preferences and effectiveness.  Patient desired IUD or IUS. She will [facility] to [facility].   - Discussed birth spacing of 18 months  4. Sleep and fatigue -Encouraged family/partner/community support of 4 hrs of uninterrupted sleep to help with mood and fatigue  5. Physical Recovery  - Discussed patients delivery and complications. She describes her labor as good. - Patient had a Vaginal, no problems at delivery. Patient had no laceration. Perineal healing reviewed. Patient expressed understanding - Patient has urinary  incontinence? No. - Patient is safe to resume physical and sexual activity  6.  Health Maintenance - HM due items addressed Yes - Last pap smear  Diagnosis  Date Value Ref Range Status  05/08/2016   Final   NEGATIVE FOR INTRAEPITHELIAL LESIONS OR MALIGNANCY.   Pap smear not done at today's visit. Referred to [facility]CP -Breast Cancer screening indicated? No.   7. Chronic Disease/Pregnancy Condition follow up: None  - PCP follow up  Vina Solian, MD Center for [facility], [facility]

## 2024-06-15 ENCOUNTER — Telehealth: Payer: Self-pay | Admitting: Lactation Services

## 2024-06-15 NOTE — Telephone Encounter (Signed)
 Called in APNO sans Ibuprofen  to [facility] for sore nipples and 12 week old infant. They will contact patient.

## 2024-06-24 ENCOUNTER — Other Ambulatory Visit (HOSPITAL_COMMUNITY): Payer: Self-pay
